# Patient Record
Sex: Male | Born: 1940 | Race: Black or African American | Hispanic: No | Marital: Married | Smoking: Former smoker
Health system: Southern US, Community
[De-identification: ages and names within clinical notes are randomized; demographics above are authoritative.]

## PROBLEM LIST (undated history)

## (undated) DIAGNOSIS — E119 Type 2 diabetes mellitus without complications: Secondary | ICD-10-CM

## (undated) DIAGNOSIS — I10 Essential (primary) hypertension: Secondary | ICD-10-CM

## (undated) DIAGNOSIS — J449 Chronic obstructive pulmonary disease, unspecified: Secondary | ICD-10-CM

## (undated) DIAGNOSIS — F0390 Unspecified dementia without behavioral disturbance: Secondary | ICD-10-CM

---

## 2013-08-17 ENCOUNTER — Inpatient Hospital Stay (HOSPITAL_BASED_OUTPATIENT_CLINIC_OR_DEPARTMENT_OTHER)
Admission: EM | Admit: 2013-08-17 | Discharge: 2013-09-06 | DRG: 870 | Disposition: A | Discharge status: Hospice | Payer: Medicare Other | Attending: Internal Medicine | Admitting: Internal Medicine

## 2013-08-17 ENCOUNTER — Inpatient Hospital Stay (HOSPITAL_COMMUNITY): Payer: Medicare Other

## 2013-08-17 ENCOUNTER — Emergency Department (HOSPITAL_BASED_OUTPATIENT_CLINIC_OR_DEPARTMENT_OTHER): Payer: Medicare Other

## 2013-08-17 ENCOUNTER — Encounter (HOSPITAL_BASED_OUTPATIENT_CLINIC_OR_DEPARTMENT_OTHER): Payer: Self-pay | Admitting: Emergency Medicine

## 2013-08-17 DIAGNOSIS — D539 Nutritional anemia, unspecified: Secondary | ICD-10-CM | POA: Diagnosis not present

## 2013-08-17 DIAGNOSIS — R6520 Severe sepsis without septic shock: Secondary | ICD-10-CM | POA: Diagnosis present

## 2013-08-17 DIAGNOSIS — R5381 Other malaise: Secondary | ICD-10-CM | POA: Diagnosis not present

## 2013-08-17 DIAGNOSIS — I10 Essential (primary) hypertension: Secondary | ICD-10-CM | POA: Diagnosis present

## 2013-08-17 DIAGNOSIS — E1169 Type 2 diabetes mellitus with other specified complication: Secondary | ICD-10-CM | POA: Diagnosis not present

## 2013-08-17 DIAGNOSIS — Z515 Encounter for palliative care: Secondary | ICD-10-CM

## 2013-08-17 DIAGNOSIS — J9600 Acute respiratory failure, unspecified whether with hypoxia or hypercapnia: Secondary | ICD-10-CM | POA: Diagnosis present

## 2013-08-17 DIAGNOSIS — I5032 Chronic diastolic (congestive) heart failure: Secondary | ICD-10-CM | POA: Diagnosis present

## 2013-08-17 DIAGNOSIS — R0600 Dyspnea, unspecified: Secondary | ICD-10-CM

## 2013-08-17 DIAGNOSIS — J9692 Respiratory failure, unspecified with hypercapnia: Secondary | ICD-10-CM

## 2013-08-17 DIAGNOSIS — Z79899 Other long term (current) drug therapy: Secondary | ICD-10-CM

## 2013-08-17 DIAGNOSIS — R0609 Other forms of dyspnea: Secondary | ICD-10-CM | POA: Diagnosis present

## 2013-08-17 DIAGNOSIS — E873 Alkalosis: Secondary | ICD-10-CM | POA: Diagnosis not present

## 2013-08-17 DIAGNOSIS — R6521 Severe sepsis with septic shock: Secondary | ICD-10-CM

## 2013-08-17 DIAGNOSIS — F0390 Unspecified dementia without behavioral disturbance: Secondary | ICD-10-CM | POA: Diagnosis present

## 2013-08-17 DIAGNOSIS — Z87891 Personal history of nicotine dependence: Secondary | ICD-10-CM | POA: Diagnosis not present

## 2013-08-17 DIAGNOSIS — E871 Hypo-osmolality and hyponatremia: Secondary | ICD-10-CM | POA: Diagnosis present

## 2013-08-17 DIAGNOSIS — K5900 Constipation, unspecified: Secondary | ICD-10-CM | POA: Diagnosis not present

## 2013-08-17 DIAGNOSIS — Z66 Do not resuscitate: Secondary | ICD-10-CM | POA: Diagnosis not present

## 2013-08-17 DIAGNOSIS — J9601 Acute respiratory failure with hypoxia: Secondary | ICD-10-CM

## 2013-08-17 DIAGNOSIS — E872 Acidosis, unspecified: Secondary | ICD-10-CM | POA: Diagnosis present

## 2013-08-17 DIAGNOSIS — Z794 Long term (current) use of insulin: Secondary | ICD-10-CM | POA: Diagnosis not present

## 2013-08-17 DIAGNOSIS — F411 Generalized anxiety disorder: Secondary | ICD-10-CM

## 2013-08-17 DIAGNOSIS — Z7982 Long term (current) use of aspirin: Secondary | ICD-10-CM

## 2013-08-17 DIAGNOSIS — R0989 Other specified symptoms and signs involving the circulatory and respiratory systems: Secondary | ICD-10-CM | POA: Diagnosis present

## 2013-08-17 DIAGNOSIS — J690 Pneumonitis due to inhalation of food and vomit: Secondary | ICD-10-CM | POA: Diagnosis present

## 2013-08-17 DIAGNOSIS — G9340 Encephalopathy, unspecified: Secondary | ICD-10-CM | POA: Diagnosis present

## 2013-08-17 DIAGNOSIS — E118 Type 2 diabetes mellitus with unspecified complications: Secondary | ICD-10-CM

## 2013-08-17 DIAGNOSIS — E119 Type 2 diabetes mellitus without complications: Secondary | ICD-10-CM | POA: Diagnosis present

## 2013-08-17 DIAGNOSIS — J189 Pneumonia, unspecified organism: Secondary | ICD-10-CM

## 2013-08-17 DIAGNOSIS — J9691 Respiratory failure, unspecified with hypoxia: Secondary | ICD-10-CM

## 2013-08-17 DIAGNOSIS — A419 Sepsis, unspecified organism: Secondary | ICD-10-CM | POA: Diagnosis present

## 2013-08-17 DIAGNOSIS — R1310 Dysphagia, unspecified: Secondary | ICD-10-CM | POA: Diagnosis not present

## 2013-08-17 DIAGNOSIS — E876 Hypokalemia: Secondary | ICD-10-CM | POA: Diagnosis not present

## 2013-08-17 DIAGNOSIS — J441 Chronic obstructive pulmonary disease with (acute) exacerbation: Secondary | ICD-10-CM | POA: Diagnosis present

## 2013-08-17 DIAGNOSIS — E870 Hyperosmolality and hypernatremia: Secondary | ICD-10-CM | POA: Diagnosis not present

## 2013-08-17 DIAGNOSIS — J80 Acute respiratory distress syndrome: Secondary | ICD-10-CM

## 2013-08-17 DIAGNOSIS — R29898 Other symptoms and signs involving the musculoskeletal system: Secondary | ICD-10-CM | POA: Diagnosis present

## 2013-08-17 HISTORY — DX: Type 2 diabetes mellitus without complications: E11.9

## 2013-08-17 HISTORY — DX: Chronic obstructive pulmonary disease, unspecified: J44.9

## 2013-08-17 HISTORY — DX: Unspecified dementia, unspecified severity, without behavioral disturbance, psychotic disturbance, mood disturbance, and anxiety: F03.90

## 2013-08-17 HISTORY — DX: Essential (primary) hypertension: I10

## 2013-08-17 LAB — CBC WITH DIFFERENTIAL/PLATELET
Basophils Absolute: 0 10*3/uL (ref 0.0–0.1)
Basophils Relative: 0 % (ref 0–1)
EOS ABS: 0 10*3/uL (ref 0.0–0.7)
EOS PCT: 0 % (ref 0–5)
HEMATOCRIT: 41.8 % (ref 39.0–52.0)
Hemoglobin: 14.8 g/dL (ref 13.0–17.0)
Lymphocytes Relative: 12 % (ref 12–46)
Lymphs Abs: 1 10*3/uL (ref 0.7–4.0)
MCH: 33.1 pg (ref 26.0–34.0)
MCHC: 35.4 g/dL (ref 30.0–36.0)
MCV: 93.5 fL (ref 78.0–100.0)
MONO ABS: 0.4 10*3/uL (ref 0.1–1.0)
Monocytes Relative: 5 % (ref 3–12)
Neutro Abs: 6.8 10*3/uL (ref 1.7–7.7)
Neutrophils Relative %: 84 % — ABNORMAL HIGH (ref 43–77)
Platelets: 263 10*3/uL (ref 150–400)
RBC: 4.47 MIL/uL (ref 4.22–5.81)
RDW: 13 % (ref 11.5–15.5)
WBC: 8.1 10*3/uL (ref 4.0–10.5)

## 2013-08-17 LAB — URINE MICROSCOPIC-ADD ON

## 2013-08-17 LAB — URINALYSIS, ROUTINE W REFLEX MICROSCOPIC
Bilirubin Urine: NEGATIVE
Bilirubin Urine: NEGATIVE
Glucose, UA: NEGATIVE mg/dL
Glucose, UA: NEGATIVE mg/dL
KETONES UR: NEGATIVE mg/dL
Ketones, ur: NEGATIVE mg/dL
NITRITE: NEGATIVE
Nitrite: NEGATIVE
PROTEIN: 30 mg/dL — AB
Protein, ur: NEGATIVE mg/dL
SPECIFIC GRAVITY, URINE: 1.018 (ref 1.005–1.030)
Specific Gravity, Urine: 1.016 (ref 1.005–1.030)
UROBILINOGEN UA: 0.2 mg/dL (ref 0.0–1.0)
UROBILINOGEN UA: 0.2 mg/dL (ref 0.0–1.0)
pH: 5.5 (ref 5.0–8.0)
pH: 5 (ref 5.0–8.0)

## 2013-08-17 LAB — BLOOD GAS, ARTERIAL
Acid-Base Excess: 0.1 mmol/L (ref 0.0–2.0)
BICARBONATE: 26 meq/L — AB (ref 20.0–24.0)
Drawn by: 28701
FIO2: 1 %
MECHVT: 440 mL
O2 SAT: 89.3 %
PEEP: 10 cmH2O
Patient temperature: 98.6
RATE: 26 resp/min
TCO2: 27.7 mmol/L (ref 0–100)
pCO2 arterial: 56.4 mmHg — ABNORMAL HIGH (ref 35.0–45.0)
pH, Arterial: 7.285 — ABNORMAL LOW (ref 7.350–7.450)
pO2, Arterial: 59.7 mmHg — ABNORMAL LOW (ref 80.0–100.0)

## 2013-08-17 LAB — COMPREHENSIVE METABOLIC PANEL
ALT: 33 U/L (ref 0–53)
AST: 30 U/L (ref 0–37)
Albumin: 4 g/dL (ref 3.5–5.2)
Alkaline Phosphatase: 73 U/L (ref 39–117)
BILIRUBIN TOTAL: 0.4 mg/dL (ref 0.3–1.2)
BUN: 24 mg/dL — AB (ref 6–23)
CALCIUM: 9.1 mg/dL (ref 8.4–10.5)
CO2: 31 meq/L (ref 19–32)
CREATININE: 0.9 mg/dL (ref 0.50–1.35)
Chloride: 78 mEq/L — ABNORMAL LOW (ref 96–112)
GFR calc Af Amer: >90 mL/min (ref >90)
GFR calc non Af Amer: 82 mL/min — ABNORMAL LOW (ref >90)
Glucose, Bld: 226 mg/dL — ABNORMAL HIGH (ref 70–99)
Potassium: 6.3 mEq/L — ABNORMAL HIGH (ref 3.7–5.3)
Sodium: 122 mEq/L — ABNORMAL LOW (ref 137–147)
Total Protein: 7.8 g/dL (ref 6.0–8.3)

## 2013-08-17 LAB — TROPONIN I: Troponin I: <0.3 ng/mL (ref <0.30)

## 2013-08-17 LAB — GLUCOSE, CAPILLARY: Glucose-Capillary: 166 mg/dL — ABNORMAL HIGH (ref 70–99)

## 2013-08-17 LAB — MRSA PCR SCREENING: MRSA BY PCR: NEGATIVE

## 2013-08-17 LAB — PROTIME-INR
INR: 1.09 (ref 0.00–1.49)
Prothrombin Time: 14.1 seconds (ref 11.6–15.2)

## 2013-08-17 LAB — OCCULT BLOOD X 1 CARD TO LAB, STOOL: Fecal Occult Bld: NEGATIVE

## 2013-08-17 LAB — I-STAT ARTERIAL BLOOD GAS, ED
ACID-BASE EXCESS: 4 mmol/L — AB (ref 0.0–2.0)
BICARBONATE: 33.7 meq/L — AB (ref 20.0–24.0)
O2 Saturation: 99 %
PO2 ART: 149 mmHg — AB (ref 80.0–100.0)
TCO2: 36 mmol/L (ref 0–100)
pCO2 arterial: 75.9 mmHg (ref 35.0–45.0)
pH, Arterial: 7.255 — ABNORMAL LOW (ref 7.350–7.450)

## 2013-08-17 LAB — CBG MONITORING, ED: Glucose-Capillary: 215 mg/dL — ABNORMAL HIGH (ref 70–99)

## 2013-08-17 LAB — APTT: aPTT: 28 seconds (ref 24–37)

## 2013-08-17 LAB — I-STAT CG4 LACTIC ACID, ED: Lactic Acid, Venous: 2.15 mmol/L (ref 0.5–2.2)

## 2013-08-17 MED ORDER — PANTOPRAZOLE SODIUM 40 MG IV SOLR
40.0000 mg | Freq: Every day | INTRAVENOUS | Status: DC
  Administered 2013-08-18: 40 mg via INTRAVENOUS
  Filled 2013-08-17 (×2): qty 40

## 2013-08-17 MED ORDER — ALBUTEROL SULFATE (2.5 MG/3ML) 0.083% IN NEBU: 2.5000 mg | INHALATION_SOLUTION | RESPIRATORY_TRACT | Status: DC | PRN

## 2013-08-17 MED ORDER — PROPOFOL 10 MG/ML IV EMUL: 5.0000 ug/kg/min | INTRAVENOUS | Status: DC

## 2013-08-17 MED ORDER — FENTANYL CITRATE 0.05 MG/ML IJ SOLN
INTRAMUSCULAR | Status: AC
  Administered 2013-08-17: 50 ug via INTRAVENOUS
  Filled 2013-08-17: qty 2

## 2013-08-17 MED ORDER — SODIUM CHLORIDE 0.9 % IV BOLUS (SEPSIS)
1000.0000 mL | Freq: Once | INTRAVENOUS | Status: AC
  Administered 2013-08-17: 1000 mL via INTRAVENOUS

## 2013-08-17 MED ORDER — FENTANYL CITRATE 0.05 MG/ML IJ SOLN
50.0000 ug | INTRAMUSCULAR | Status: DC | PRN
  Administered 2013-08-18: 50 ug via INTRAVENOUS

## 2013-08-17 MED ORDER — INSULIN ASPART 100 UNIT/ML ~~LOC~~ SOLN
2.0000 [IU] | SUBCUTANEOUS | Status: DC
  Administered 2013-08-18: 4 [IU] via SUBCUTANEOUS
  Administered 2013-08-18: 2 [IU] via SUBCUTANEOUS
  Administered 2013-08-18: 4 [IU] via SUBCUTANEOUS
  Administered 2013-08-18: 6 [IU] via SUBCUTANEOUS
  Administered 2013-08-18 – 2013-08-19 (×5): 4 [IU] via SUBCUTANEOUS
  Administered 2013-08-19: 6 [IU] via SUBCUTANEOUS
  Administered 2013-08-19 – 2013-08-21 (×5): 2 [IU] via SUBCUTANEOUS
  Administered 2013-08-21 (×3): 4 [IU] via SUBCUTANEOUS
  Administered 2013-08-21: 2 [IU] via SUBCUTANEOUS
  Administered 2013-08-21 – 2013-08-22 (×3): 4 [IU] via SUBCUTANEOUS

## 2013-08-17 MED ORDER — ROCURONIUM BROMIDE 50 MG/5ML IV SOLN
INTRAVENOUS | Status: DC
Start: 2013-08-17 — End: 2013-08-17
  Filled 2013-08-17: qty 2

## 2013-08-17 MED ORDER — PROPOFOL 10 MG/ML IV EMUL
5.0000 ug/kg/min | INTRAVENOUS | Status: DC
  Administered 2013-08-17: 5 ug/kg/min via INTRAVENOUS

## 2013-08-17 MED ORDER — HEPARIN SODIUM (PORCINE) 5000 UNIT/ML IJ SOLN
5000.0000 [IU] | Freq: Three times a day (TID) | INTRAMUSCULAR | Status: DC
  Administered 2013-08-18 – 2013-08-28 (×31): 5000 [IU] via SUBCUTANEOUS
  Filled 2013-08-17 (×34): qty 1

## 2013-08-17 MED ORDER — PROPOFOL 10 MG/ML IV EMUL
INTRAVENOUS | Status: AC
  Filled 2013-08-17: qty 100

## 2013-08-17 MED ORDER — SODIUM CHLORIDE 0.9 % IV SOLN
250.0000 mL | INTRAVENOUS | Status: DC | PRN
Start: 2013-08-17 — End: 2013-09-06

## 2013-08-17 MED ORDER — VANCOMYCIN HCL IN DEXTROSE 1-5 GM/200ML-% IV SOLN
1000.0000 mg | Freq: Once | INTRAVENOUS | Status: AC
  Administered 2013-08-18: 1000 mg via INTRAVENOUS
  Filled 2013-08-17: qty 200

## 2013-08-17 MED ORDER — SODIUM CHLORIDE 0.9 % IV SOLN
20.0000 ug/h | INTRAVENOUS | Status: DC
  Administered 2013-08-18: 20 ug/h via INTRAVENOUS
  Filled 2013-08-17: qty 50

## 2013-08-17 MED ORDER — FENTANYL CITRATE 0.05 MG/ML IJ SOLN
INTRAMUSCULAR | Status: AC
  Filled 2013-08-17: qty 2

## 2013-08-17 MED ORDER — SODIUM CHLORIDE 0.9 % IV SOLN
2.0000 mg/h | INTRAVENOUS | Status: DC
  Filled 2013-08-17: qty 10

## 2013-08-17 MED ORDER — PIPERACILLIN-TAZOBACTAM 3.375 G IVPB
3.3750 g | Freq: Three times a day (TID) | INTRAVENOUS | Status: DC
  Administered 2013-08-18 – 2013-08-19 (×4): 3.375 g via INTRAVENOUS
  Filled 2013-08-17 (×6): qty 50

## 2013-08-17 MED ORDER — ROCURONIUM BROMIDE 50 MG/5ML IV SOLN
INTRAVENOUS | Status: AC
  Filled 2013-08-17: qty 2

## 2013-08-17 MED ORDER — FENTANYL CITRATE 0.05 MG/ML IJ SOLN
100.0000 ug | Freq: Once | INTRAMUSCULAR | Status: AC
  Administered 2013-08-17: 100 ug via INTRAVENOUS

## 2013-08-17 MED ORDER — VANCOMYCIN HCL IN DEXTROSE 1-5 GM/200ML-% IV SOLN
1000.0000 mg | Freq: Two times a day (BID) | INTRAVENOUS | Status: DC
  Administered 2013-08-18 – 2013-08-20 (×4): 1000 mg via INTRAVENOUS
  Filled 2013-08-17 (×5): qty 200

## 2013-08-17 MED ORDER — SUCCINYLCHOLINE CHLORIDE 20 MG/ML IJ SOLN
INTRAMUSCULAR | Status: AC
  Administered 2013-08-17: 80 mg
  Filled 2013-08-17: qty 1

## 2013-08-17 MED ORDER — VECURONIUM BROMIDE 10 MG IV SOLR
10.0000 mg | Freq: Once | INTRAVENOUS | Status: AC
  Administered 2013-08-17: 10 mg via INTRAVENOUS

## 2013-08-17 MED ORDER — MIDAZOLAM HCL 5 MG/ML IJ SOLN
0.0000 mg/h | INTRAMUSCULAR | Status: DC
  Administered 2013-08-18: 1 mg/h via INTRAVENOUS
  Filled 2013-08-17: qty 10

## 2013-08-17 MED ORDER — VECURONIUM BROMIDE 10 MG IV SOLR
INTRAVENOUS | Status: AC
  Administered 2013-08-17: 10 mg
  Filled 2013-08-17: qty 10

## 2013-08-17 MED ORDER — MIDAZOLAM HCL 2 MG/2ML IJ SOLN
2.0000 mg | Freq: Once | INTRAMUSCULAR | Status: AC
  Administered 2013-08-17: 2 mg via INTRAVENOUS

## 2013-08-17 MED ORDER — FENTANYL CITRATE 0.05 MG/ML IJ SOLN: 100.0000 ug | Freq: Once | INTRAMUSCULAR | Status: DC

## 2013-08-17 MED ORDER — FENTANYL CITRATE 0.05 MG/ML IJ SOLN
50.0000 ug | Freq: Once | INTRAMUSCULAR | Status: AC
  Administered 2013-08-17: 50 ug via INTRAVENOUS

## 2013-08-17 MED ORDER — IPRATROPIUM-ALBUTEROL 0.5-2.5 (3) MG/3ML IN SOLN
3.0000 mL | RESPIRATORY_TRACT | Status: DC
  Administered 2013-08-18: 3 mL via RESPIRATORY_TRACT

## 2013-08-17 MED ORDER — MIDAZOLAM HCL 2 MG/2ML IJ SOLN
INTRAMUSCULAR | Status: AC
  Administered 2013-08-17: 2 mg via INTRAVENOUS
  Filled 2013-08-17: qty 2

## 2013-08-17 MED ORDER — PANTOPRAZOLE SODIUM 40 MG IV SOLR
40.0000 mg | Freq: Once | INTRAVENOUS | Status: AC
Start: 2013-08-17 — End: 2013-08-17
  Administered 2013-08-17: 40 mg via INTRAVENOUS
  Filled 2013-08-17: qty 40

## 2013-08-17 MED ORDER — NOREPINEPHRINE BITARTRATE 1 MG/ML IV SOLN
2.0000 ug/min | INTRAVENOUS | Status: DC
  Administered 2013-08-18: 30 ug/min via INTRAVENOUS
  Administered 2013-08-18 (×2): 40 ug/min via INTRAVENOUS
  Administered 2013-08-18: 4 ug/min via INTRAVENOUS
  Administered 2013-08-18: 33 ug/min via INTRAVENOUS
  Filled 2013-08-17 (×6): qty 8

## 2013-08-17 MED ORDER — ETOMIDATE 2 MG/ML IV SOLN
INTRAVENOUS | Status: AC
  Administered 2013-08-17: 20 mg
  Filled 2013-08-17: qty 20

## 2013-08-17 MED ORDER — SODIUM CHLORIDE 0.9 % IV BOLUS (SEPSIS): 1000.0000 mL | Freq: Once | INTRAVENOUS | Status: DC

## 2013-08-17 MED ORDER — MIDAZOLAM HCL 2 MG/2ML IJ SOLN
INTRAMUSCULAR | Status: AC
  Administered 2013-08-17: 2 mg
  Filled 2013-08-17: qty 2

## 2013-08-17 MED ORDER — SUCCINYLCHOLINE CHLORIDE 20 MG/ML IJ SOLN
INTRAMUSCULAR | Status: AC
  Filled 2013-08-17: qty 1

## 2013-08-17 MED ORDER — LIDOCAINE HCL (CARDIAC) 20 MG/ML IV SOLN
INTRAVENOUS | Status: AC
  Filled 2013-08-17: qty 5

## 2013-08-17 MED ORDER — MIDAZOLAM HCL 2 MG/2ML IJ SOLN
INTRAMUSCULAR | Status: AC
  Filled 2013-08-17: qty 2

## 2013-08-17 MED ORDER — FENTANYL CITRATE 0.05 MG/ML IJ SOLN: 50.0000 ug | INTRAMUSCULAR | Status: DC | PRN

## 2013-08-17 MED ORDER — ETOMIDATE 2 MG/ML IV SOLN
INTRAVENOUS | Status: AC
  Filled 2013-08-17: qty 20

## 2013-08-17 MED ORDER — MIDAZOLAM HCL 2 MG/2ML IJ SOLN
1.0000 mg | Freq: Once | INTRAMUSCULAR | Status: AC
  Administered 2013-08-17: 1 mg via INTRAVENOUS

## 2013-08-17 MED ORDER — PIPERACILLIN-TAZOBACTAM 3.375 G IVPB 30 MIN
3.3750 g | Freq: Once | INTRAVENOUS | Status: AC
  Administered 2013-08-17: 3.375 g via INTRAVENOUS
  Filled 2013-08-17 (×2): qty 50

## 2013-08-17 NOTE — ED Notes (Signed)
[facility] given report currently [facility]. Family members wife and sons x2 updated to current status and consent signed by son per mothers / wife's request and signature witness by this Clinical research associatewriter

## 2013-08-17 NOTE — Progress Notes (Signed)
Per MD Documented setting changes done:  PEEP increased to 10, Vt decreased to 6cc (440), RR increased to 26.  ABG to follow in 30 mins.

## 2013-08-17 NOTE — ED Notes (Signed)
Patient came in with shortness of breath. In triage o2 sats in the 20-30's. Patient unresponsive

## 2013-08-17 NOTE — ED Notes (Signed)
Family at bedside prior to carelink leaving

## 2013-08-17 NOTE — ED Provider Notes (Signed)
CSN: [id number]     Arrival date & time 08/17/13  1803 History   First MD Initiated Contact with Patient 08/17/13 1809    This chart was scribed for Rolan Bucco, MD by Marica Otter, ED Scribe. This patient was seen in room 2M03C/2M03C-01 and the patient's care was started at 6:10 PM.  Chief Complaint  Patient presents with  . Shortness of Breath   The history is provided by the spouse. No language interpreter was used.   HPI Comments: [name] [name] is a 73 y.o. male, with a Hx of DM, who presents to the Emergency Department complaining of SOB. Upon arrival to the ED pt's O2 stats in triage were in the 20-30s. Pt is unresponsive.   Pt presented to [facility] a little over one week ago for SOB. Consequently, he was admitted to the hospital for 5 days. While hospitalized pt was diagnosed with COPD and low sodium. Pt was discharged from the hospital three days ago; since discharge pt has been experiencing increasing fatigue, SOB, and productive cough with clear sputum. Per family, pt also had one episode of non-bloody emesis and possible, subjective fever today. Pt also recently completed a course of Levaquin for respiratory infection and Valtrex for shingles.   Hx limited and obtained per family due to pt condition.  Past Medical History  Diagnosis Date  . Hypertension   . Diabetes mellitus without complication   . COPD (chronic obstructive pulmonary disease)    History reviewed. No pertinent past surgical history. No family history on file. History  Substance Use Topics  . Smoking status: Former Games developer  . Smokeless tobacco: Not on file  . Alcohol Use: Not on file    Review of Systems  Constitutional: Positive for fever and fatigue. Negative for diaphoresis.  HENT: Negative for congestion, rhinorrhea and sneezing.   Eyes: Negative.   Respiratory: Positive for cough (with clear sputum ) and shortness of breath. Negative for chest tightness.   Cardiovascular: Negative for  chest pain and leg swelling.  Gastrointestinal: Positive for vomiting. Negative for abdominal pain, diarrhea and blood in stool.  Genitourinary: Negative for frequency, hematuria, flank pain and difficulty urinating.  Musculoskeletal: Negative for arthralgias and back pain.  Skin: Negative for rash.      Allergies  Review of patient's allergies indicates no known allergies.  Home Medications   Prior to Admission medications   Medication Sig Start Date End Date Taking? Authorizing Provider  amLODipine (NORVASC) 5 MG tablet Take 5 mg by mouth daily.   Yes Historical Provider, MD  aspirin 325 MG tablet Take 325 mg by mouth daily.   Yes Historical Provider, MD  docusate sodium (COLACE) 100 MG capsule Take 100 mg by mouth 2 (two) times daily.   Yes Historical Provider, MD  metFORMIN (GLUCOPHAGE) 500 MG tablet Take 500 mg by mouth 2 (two) times daily with a meal.   Yes Historical Provider, MD  solifenacin (VESICARE) 10 MG tablet Take by mouth daily.   Yes Historical Provider, MD  valACYclovir (VALTREX) 500 MG tablet Take 500 mg by mouth 2 (two) times daily.   Yes Historical Provider, MD   Triage Vitals: BP 168/82  Pulse 121  Resp 14  SpO2 99%  Physical Exam  Constitutional: He appears well-developed and well-nourished. He appears distressed.  unresponsive  HENT:  Head: Normocephalic and atraumatic.  Dark secretions in mouth  Eyes: Pupils are equal, round, and reactive to light.  Neck: Normal range of motion. Neck supple.  Cardiovascular: Normal rate, regular rhythm and normal heart sounds.   Pulmonary/Chest: Bradypnea noted. He is in respiratory distress. He has no wheezes. He has no rales. He exhibits no tenderness.  Limited spontaneous respiratory effort.  Pulse ox 20% on arrival on RA with good waveform  Abdominal: Soft. Bowel sounds are normal. There is no tenderness. There is no rebound and no guarding.  Musculoskeletal: Normal range of motion. He exhibits no edema.   Lymphadenopathy:    He has no cervical adenopathy.  Neurological:  unresponsive  Skin: Skin is warm and dry. No rash noted.  Psychiatric: He has a normal mood and affect.    ED Course  INTUBATION Date/Time: 08/17/2013 6:10 PM Performed by: Rolan Bucco Authorized by: Rolan Bucco Consent: The procedure was performed in an emergent situation. Indications: respiratory failure and  hypoxemia Intubation method: video-assisted Patient status: paralyzed (RSI) Preoxygenation: BVM Sedatives: etomidate Paralytic: succinylcholine Laryngoscope size: Mac 3 Tube size: 7.5 mm Tube type: cuffed Number of attempts: 3 Ventilation between attempts: BVM Cricoid pressure: yes Cords visualized: yes Post-procedure assessment: chest rise and CO2 detector Breath sounds: equal and absent over the epigastrium Cuff inflated: yes ETT to lip: 23 cm Tube secured with: ETT holder Chest x-ray interpreted by me and radiologist. Chest x-ray findings: endotracheal tube in appropriate position Patient tolerance: Patient tolerated the procedure well with no immediate complications. Comments: Pt with profuse, ongoing coffee ground emesis in oropharynx.  Had to be suctioned multiple times in between intubation attempts to visualize cords.  Pt never dropped sats below 92% during intubation.   (including critical care time)   COORDINATION OF CARE: 6:11 PM-Discussed treatment plan which includes endotracheal intubation with pt's family at bedside. Patient's family verbalizes understanding and agrees with treatment plan.  CRITICAL CARE (Endotracheal intubation) Performed by: Rolan Bucco, MD Total critical care time: 6:10PM-6:29PM 6:16PM: Pt's SpO2: 99% 6:18PM: etomidate (AMIDATE) 2 MG/ML injection 20 mg given 6:19PM: succinylcholine (ANECTINE) 20 MG/ML injection 80 mg given 6:19PM: Pt's BP: 168/82 6:25PM- Pt's SPO2: 91%; BP 142/58 Critical care time was exclusive of separately billable procedures and  treating other patients. Critical care was necessary to treat or prevent imminent or life-threatening deterioration. Critical care was time spent personally by me on the following activities: development of treatment plan with surrogate as well as nursing, evaluation of patient's response to treatment, examination of patient, obtaining history from patient or surrogate, ordering and performing treatments and interventions, ordering and review of laboratory studies, ordering and review of radiographic studies, pulse oximetry and re-evaluation of patient's condition.  CRITICAL CARE NOTE: Critical care time was provided for 90 MIN exclusive of separately billable procedures and treating other patients.  This was necessary to treat or prevent further deterioration of the following condition:  respiratory failure, which the patient had. This involved direct bedside patient care, speaking with family members, review of past medical records, reviewing the results of the laboratory and diagnostic studies, consulting with other physicians, as well as evaluating the effectiveness of the therapy instituted as described.  Labs Review Labs Reviewed  CBC WITH DIFFERENTIAL - Abnormal; Notable for the following:    Neutrophils Relative % 84 (*)    All other components within normal limits  COMPREHENSIVE METABOLIC PANEL - Abnormal; Notable for the following:    Sodium 122 (*)    Potassium 6.3 (*)    Chloride 78 (*)    Glucose, Bld 226 (*)    BUN 24 (*)    GFR calc non Af Amer 82 (*)  All other components within normal limits  URINALYSIS, ROUTINE W REFLEX MICROSCOPIC - Abnormal; Notable for the following:    APPearance CLOUDY (*)    Hgb urine dipstick SMALL (*)    Protein, ur 30 (*)    Leukocytes, UA MODERATE (*)    All other components within normal limits  URINE MICROSCOPIC-ADD ON - Abnormal; Notable for the following:    Bacteria, UA MANY (*)    Casts HYALINE CASTS (*)    All other components within  normal limits  URINALYSIS, ROUTINE W REFLEX MICROSCOPIC - Abnormal; Notable for the following:    Hgb urine dipstick SMALL (*)    Leukocytes, UA MODERATE (*)    All other components within normal limits  URINE MICROSCOPIC-ADD ON - Abnormal; Notable for the following:    Squamous Epithelial / LPF FEW (*)    Bacteria, UA FEW (*)    All other components within normal limits  BLOOD GAS, ARTERIAL - Abnormal; Notable for the following:    pH, Arterial 7.285 (*)    pCO2 arterial 56.4 (*)    pO2, Arterial 59.7 (*)    Bicarbonate 26.0 (*)    All other components within normal limits  GLUCOSE, CAPILLARY - Abnormal; Notable for the following:    Glucose-Capillary 166 (*)    All other components within normal limits  CBG MONITORING, ED - Abnormal; Notable for the following:    Glucose-Capillary 215 (*)    All other components within normal limits  I-STAT ARTERIAL BLOOD GAS, ED - Abnormal; Notable for the following:    pH, Arterial 7.255 (*)    pCO2 arterial 75.9 (*)    pO2, Arterial 149.0 (*)    Bicarbonate 33.7 (*)    Acid-Base Excess 4.0 (*)    All other components within normal limits  MRSA PCR SCREENING  URINE CULTURE  CULTURE, BLOOD (ROUTINE X 2)  CULTURE, BLOOD (ROUTINE X 2)  CULTURE, RESPIRATORY (NON-EXPECTORATED)  TROPONIN I  OCCULT BLOOD X 1 CARD TO LAB, STOOL  PROTIME-INR  APTT  CBC  LACTIC ACID, PLASMA  PRO B NATRIURETIC PEPTIDE  MAGNESIUM  PHOSPHORUS  COMPREHENSIVE METABOLIC PANEL  I-STAT CG4 LACTIC ACID, ED  I-STAT CG4 LACTIC ACID, ED  POCT GASTRIC OCCULT BLOOD (1-CARD TO LAB)    Imaging Review Ct Head Wo Contrast  08/17/2013   CLINICAL DATA:  Status post intubation with altered mental status.  EXAM: CT HEAD WITHOUT CONTRAST  TECHNIQUE: Contiguous axial images were obtained from the base of the skull through the vertex without intravenous contrast.  COMPARISON:  08/08/2013  FINDINGS: Sinuses/Soft tissues: Fluid and secretions in the nasopharynx. Trace fluid in the  bilateral maxillary sinuses. No significant soft tissue swelling. Mucosal thickening of ethmoid air cells, new. Clear mastoid air cells.  Intracranial: Mild low density in the periventricular white matter likely related to small vessel disease. No mass lesion, hemorrhage, hydrocephalus, acute infarct, intra-axial, or extra-axial fluid collection.  IMPRESSION: 1.  No acute intracranial abnormality. 2. Mild small vessel ischemic change. 3. New mild sinus disease. This could at least partially be secondary to intubation.   Electronically Signed   By: Jeronimo GreavesKyle  Talbot M.D.   On: 08/17/2013 20:15   Dg Chest Port 1 View  08/17/2013   CLINICAL DATA:  Line placement  EXAM: PORTABLE CHEST - 1 VIEW  COMPARISON:  Chest x-ray from earlier the same day  FINDINGS: Interval placement of left subclavian central line. No complicating pneumothorax or visible hemothorax. The endotracheal tube remains in good  position, tip at the mid thoracic trachea level. Orogastric tube ends in the mid stomach.  Stable heart size and upper mediastinal contours.  Diffuse worsening of lung aeration, with fairly symmetric and perihilar predominant airspace disease.  IMPRESSION: 1. New left subclavian central line in good position. No pneumothorax. 2. Extensive bilateral lung opacity. Rapid worsening favors pulmonary edema (likely noncardiogenic), large volume aspiration, or pneumonia responding to hydration.   Electronically Signed   By: Tiburcio PeaJonathan  Watts M.D.   On: 08/17/2013 23:45   Dg Chest Port 1 View  08/17/2013   CLINICAL DATA:  Shortness of breath.  Status post intubation.  EXAM: PORTABLE CHEST - 1 VIEW  COMPARISON:  08/14/2013  FINDINGS: Endotracheal tube terminates 4.4 cm above carina. Nasogastric tube extends beyond the inferior aspect of the film.  Normal heart size. Possible small left pleural effusion. No pneumothorax. New right upper lobe airspace disease. Similar left base volume loss.  IMPRESSION: Appropriate position of endotracheal  tube.  New right upper Lobe airspace disease, suspicious for infection or aspiration  Possible small left pleural effusion with adjacent volume loss and atelectasis.   Electronically Signed   By: Jeronimo GreavesKyle  Talbot M.D.   On: 08/17/2013 19:27     EKG Interpretation None      MDM   Final diagnoses:  Respiratory failure with hypoxia and hypercapnia   Pt presented unresponsive with limited spontaneous respirations.  Intitial sat was 20% on RA.  Was ventilated with BVM and sats came up to 90s over about 1-2 minutes.  Good waveform throughout.  From history, sound as if respiratory failure due to possible COPD worsening.  PT was covered with broad spectrum abx for pneumonia.  He almost certainly aspirated due to large volume of secretions in airway prior to intubation.  He maintained a good BP throughout, other than 2 times, was started on propofol for sedation and dropped BP both times.  BP came back up after fluids and stopping propofol.  Given other meds for sedation/paralysis as he was biting on the ETT.  Pt maintains sats 96-100% following intubation. Has had large amounts of coffee ground emesis, but at this point, HCT normal.  Given protonix.  Rectal heme negative. Hyponatremic, given IVFs.  Pt afebrile.  No ICH or signs of large CVA on CT.  Spoke with CCM, DR. Herma CarsonZ, who has accept pt [facility]  [facility] neg head CT/brain MRI, admission BMP showed sodium 127, discharged at 140, CT chest without contrast negative, negative d-dimer.  Pt was diagnosed with likely COPD exacerbation, metabolic encephalopathy, hyponatremia.  Per note, was discharge with inhalers, but family says that he has not had any inhalers.  I personally performed the services described in this documentation, which was scribed in my presence.  The recorded information has been reviewed and considered.    Rolan BuccoMelanie Belfi, MD 08/18/13 [phone]

## 2013-08-17 NOTE — ED Notes (Signed)
ABG results given to Dr. Fredderick PhenixBelfi, vt increased to 600/ 8cc's rate changed to 18.

## 2013-08-17 NOTE — ED Notes (Signed)
I took vitals prior to transport to x-ray, BP of 75/34 taken automatically, as reason took another BP manually, notified nurse of same.

## 2013-08-17 NOTE — H&P (Signed)
PULMONARY / CRITICAL CARE MEDICINE   Name: [name] [name] MRN: [id number] DOB: February 26, 1940    ADMISSION DATE:  08/17/2013  PRIMARY SERVICE: PCCM  CHIEF COMPLAINT:  Respiratory distress.   BRIEF PATIENT DESCRIPTION:  73 years old with DM, HTN and COPD. Pres[facility] with acute hypoxemic respiratory failure requiring intubation. Chest X ray with bilateral patchy infiltrates.   SIGNIFICANT EVENTS / STUDIES:  - Chest X ray: Bilateral patchy infiltrates.   LINES / TUBES: - Peripheral IV's - Foley Catheter - ETT - Left subclavian CVC  CULTURES: - Bood cultures 6/27 > - Tracheal aspirate 6/27 > - Urine culture 6/27 >  ANTIBIOTICS: - Zosyn  - Vancomycin - Recently completed levaquin for COPD exacerbation.   HISTORY OF PRESENT ILLNESS:   73 years old with DM, HTN and COPD. [facility] with acute hypoxemic respiratory failure. Initial oxygen saturation in the 20's, intubated in the ED. As per the records, he was admitted recently for 5 days with a COPD exacerbation and completed a course of Levaquin. Discharged 3 days ago. Since discharge more fatigued and progressively more SOB. He had an episode of vomiting. Unclear if he had fever today. At the time of my examination the patient is intubated, sedated, hypotensive with MAP of 55 to 60, saturating 83 to 88% on 100% FiO2 and PEEP of 5.   PAST MEDICAL HISTORY :  Past Medical History  Diagnosis Date  . Hypertension   . Diabetes mellitus without complication   . COPD (chronic obstructive pulmonary disease)    History reviewed. No pertinent past surgical history. Prior to Admission medications   Medication Sig Start Date End Date Taking? Authorizing Yakira Duquette  amLODipine (NORVASC) 5 MG tablet Take 5 mg by mouth daily.   Yes Historical Derika Eckles, MD  aspirin 325 MG tablet Take 325 mg by mouth daily.   Yes Historical Joleen Stuckert, MD  docusate sodium (COLACE) 100 MG capsule Take 100 mg by  mouth 2 (two) times daily.   Yes Historical Kam Rahimi, MD  metFORMIN (GLUCOPHAGE) 500 MG tablet Take 500 mg by mouth 2 (two) times daily with a meal.   Yes Historical Neshia Mckenzie, MD  solifenacin (VESICARE) 10 MG tablet Take by mouth daily.   Yes Historical Taylen Wendland, MD  valACYclovir (VALTREX) 500 MG tablet Take 500 mg by mouth 2 (two) times daily.   Yes Historical Kemarion Abbey, MD   No Known Allergies  FAMILY HISTORY:  No family history on file. SOCIAL HISTORY:  reports that he has quit smoking. He does not have any smokeless tobacco history on file. His alcohol and drug histories are not on file.  REVIEW OF SYSTEMS:  Unable to provide  SUBJECTIVE:   VITAL SIGNS: Temp:  [98.5 F (36.9 C)-99.8 F (37.7 C)] 99 F (37.2 C) (06/27 2013) Pulse Rate:  [92-121] 98 (06/27 2230) Resp:  [14-26] 26 (06/27 2230) BP: (64-180)/(34-99) 89/49 mmHg (06/27 2230) SpO2:  [83 %-100 %] 83 % (06/27 2230) FiO2 (%):  [80 %-100 %] 100 % (06/27 2229) Weight:  [173 lb 14.4 oz (78.881 kg)] 173 lb 14.4 oz (78.881 kg) (06/27 2012) HEMODYNAMICS:   VENTILATOR SETTINGS: Vent Mode:  [-] PRVC FiO2 (%):  [80 %-100 %] 100 % Set Rate:  [16 bmp-26 bmp] 26 bmp Vt Set:  [440 mL-600 mL] 440 mL PEEP:  [5 cmH20-10 cmH20] 10 cmH20 Plateau Pressure:  [16 cmH20-34 cmH20] 34 cmH20 INTAKE / OUTPUT: Intake/Output     06/27 0701 - 06/28 0700  Urine (mL/kg/hr) 150   Total Output 150   Net -150         PHYSICAL EXAMINATION: General: Sedated, intubated, no acute distress. Eyes: Anicteric sclerae. Pupils are ENT: ETT in place. Trachea at midline.  Lymph: No cervical, supraclavicular, or axillary lymphadenopathy. Heart: Normal S1, S2. No murmurs, rubs, or gallops appreciated. No bruits, equal pulses. Lungs: Normal excursion, no dullness to percussion. Diminished breath sounds bilaterally, without wheezes or crackles.  Abdomen: Abdomen soft, non-tender and not distended, normoactive bowel sounds. No hepatosplenomegaly or  masses. Musculoskeletal: No clubbing or synovitis. No LE edema Skin: No rashes or lesions Neuro: Patient is unresponsive. Sedated  LABS:  CBC  Recent Labs Lab 08/17/13 1835  WBC 8.1  HGB 14.8  HCT 41.8  PLT 263   Coag's  Recent Labs Lab 08/17/13 1915  APTT 28  INR 1.09   BMET  Recent Labs Lab 08/17/13 1835  NA 122*  K 6.3*  CL 78*  CO2 31  BUN 24*  CREATININE 0.90  GLUCOSE 226*   Electrolytes  Recent Labs Lab 08/17/13 1835  CALCIUM 9.1   Sepsis Markers  Recent Labs Lab 08/17/13 1855  LATICACIDVEN 2.15   ABG  Recent Labs Lab 08/17/13 1922  PHART 7.255*  PCO2ART 75.9*  PO2ART 149.0*   Liver Enzymes  Recent Labs Lab 08/17/13 1835  AST 30  ALT 33  ALKPHOS 73  BILITOT 0.4  ALBUMIN 4.0   Cardiac Enzymes  Recent Labs Lab 08/17/13 1835  TROPONINI <0.30   Glucose  Recent Labs Lab 08/17/13 1817  GLUCAP 215*    Imaging Ct Head Wo Contrast  08/17/2013   CLINICAL DATA:  Status post intubation with altered mental status.  EXAM: CT HEAD WITHOUT CONTRAST  TECHNIQUE: Contiguous axial images were obtained from the base of the skull through the vertex without intravenous contrast.  COMPARISON:  08/08/2013  FINDINGS: Sinuses/Soft tissues: Fluid and secretions in the nasopharynx. Trace fluid in the bilateral maxillary sinuses. No significant soft tissue swelling. Mucosal thickening of ethmoid air cells, new. Clear mastoid air cells.  Intracranial: Mild low density in the periventricular white matter likely related to small vessel disease. No mass lesion, hemorrhage, hydrocephalus, acute infarct, intra-axial, or extra-axial fluid collection.  IMPRESSION: 1.  No acute intracranial abnormality. 2. Mild small vessel ischemic change. 3. New mild sinus disease. This could at least partially be secondary to intubation.   Electronically Signed   By: Jeronimo GreavesKyle  Talbot M.D.   On: 08/17/2013 20:15   Dg Chest Port 1 View  08/17/2013   CLINICAL DATA:  Shortness of  breath.  Status post intubation.  EXAM: PORTABLE CHEST - 1 VIEW  COMPARISON:  08/14/2013  FINDINGS: Endotracheal tube terminates 4.4 cm above carina. Nasogastric tube extends beyond the inferior aspect of the film.  Normal heart size. Possible small left pleural effusion. No pneumothorax. New right upper lobe airspace disease. Similar left base volume loss.  IMPRESSION: Appropriate position of endotracheal tube.  New right upper Lobe airspace disease, suspicious for infection or aspiration  Possible small left pleural effusion with adjacent volume loss and atelectasis.   Electronically Signed   By: Jeronimo GreavesKyle  Talbot M.D.   On: 08/17/2013 19:27     CXR:  Bilateral patchy infiltrates.  ASSESSMENT / PLAN:  PULMONARY A: 1) Acute hypoxemic and hypercarbic respiratory failure. 2) Bilateral patchy infiltrates compatible with ARDS, likely aspiration pneumonia given dark colored tracheal aspirate. 3) COPD P:   - Mechanical ventilation per ARDS protocol   -  PRVC, Vt: 6cc/kg, PEEP: 10, RR: 26, FiO2: 100% and adjust per ARDS protocol   - VAP prevention order set   - Daily awakening and SBT - Zosyn / Vancomycin - Scheduled Duonebs - PRN albuterol nebs  CARDIOVASCULAR A:  1) Hypotension, likely sepsis related  2) Negative troponin, borderline lactic acid, volume depleted on exam. P:  - Will repeat lactic acid - Will get BNP - 1 L NS bolus - Levophed to keep MAP of 65 - Echocardiogram in am  RENAL A:   1) Normal creatinine at admission 2) Hyponatremia, likely volume depletion, poor oral intake. P:   - Continue IVF resuscitation - Will follow CMP  GASTROINTESTINAL A:   1) No issues P:   - Protonix for GI prophylaxis  HEMATOLOGIC A:   1) No issues P:  - Will follow CBC  INFECTIOUS A:   1) Aspiration pneumonia vs HCAP P:   - Zosyn - Vancomycin - We will follow cultures  ENDOCRINE A:   1) DM P:   - Novolog sliding scale per ICU hyperglycemia protocol  NEUROLOGIC A:   1)  Intubated, sedated P:   - RASS goal: -1 to -2 - Versed drip (profound hypotension with propofol) - Fentanyl drip  TODAY'S SUMMARY:   I have personally obtained a history, examined the patient, evaluated laboratory and imaging results, formulated the assessment and plan and placed orders. CRITICAL CARE: The patient is critically ill with multiple organ systems failure and requires high complexity decision making for assessment and support, frequent evaluation and titration of therapies, application of advanced monitoring technologies and extensive interpretation of multiple databases. Critical Care Time devoted to patient care services described in this note is 60 minutes.   Overton Mam, MD Pulmonary and Critical Care Medicine [facility] Pager: [phone]  08/17/2013, 10:54 PM

## 2013-08-17 NOTE — ED Notes (Signed)
Per request of [facility], pt started back on Propofol at 15 mcg/kg/min

## 2013-08-17 NOTE — ED Notes (Signed)
Fentanyl ordered for en route transportation for [facility] to [facility].

## 2013-08-17 NOTE — Procedures (Signed)
Central Venous Catheter Insertion Procedure Note Vaughan BastaJasper Hughart [id number] 07/10/1940  Procedure: Insertion of Central Venous Catheter Indications: Assessment of intravascular volume, Drug and/or fluid administration and Frequent blood sampling  Procedure Details Consent: Risks of procedure as well as the alternatives and risks of each were explained to the (patient/caregiver).  Consent for procedure obtained. and Unable to obtain consent because of altered level of consciousness. Time Out: Verified patient identification, verified procedure, site/side was marked, verified correct patient position, special equipment/implants available, medications/allergies/relevent history reviewed, required imaging and test results available.  Performed  Maximum sterile technique was used including antiseptics, cap, gloves, gown, hand hygiene, mask and sheet. Skin prep: Chlorhexidine; local anesthetic administered A antimicrobial bonded/coated triple lumen catheter was placed in the left subclavian vein using the Seldinger technique.  Evaluation Blood flow good Complications: No apparent complications Patient did tolerate procedure well. Chest X-ray ordered to verify placement.  CXR: normal.  SIQUEIROS, ALAN 08/17/2013, 11:13 PM

## 2013-08-17 NOTE — ED Notes (Signed)
Pt biting tube and clinched down. MD Belfi made aware.

## 2013-08-17 NOTE — ED Notes (Signed)
Propofol held at this time per MD Belfi, pt to get Fentanyl drip

## 2013-08-17 NOTE — ED Notes (Signed)
[facility] at [facility]

## 2013-08-17 NOTE — ED Notes (Signed)
[facility] awaiting to discuss case with CCM prior to leaving with patient.

## 2013-08-17 NOTE — Progress Notes (Signed)
ANTIBIOTIC CONSULT NOTE - INITIAL  Pharmacy Consult for Vancomycin/Zosyn Indication: pneumonia  No Known Allergies  Patient Measurements: Height: 5\' 10"  (177.8 cm) Weight: 173 lb 14.4 oz (78.881 kg) IBW/kg (Calculated) : 73  Vital Signs: Temp: 98.8 F (37.1 C) (06/27 2230) Temp src: Oral (06/27 2230) BP: 97/47 mmHg (06/27 2245) Pulse Rate: 97 (06/27 2245)  Labs:  Recent Labs  08/17/13 1835  WBC 8.1  HGB 14.8  PLT 263  CREATININE 0.90   Estimated Creatinine Clearance: 75.5 ml/min (by C-G formula based on Cr of 0.9).  Medical History: Past Medical History  Diagnosis Date  . Hypertension   . Diabetes mellitus without complication   . COPD (chronic obstructive pulmonary disease)     Assessment: 73 y/o M to start broad spectrum antibiotics for possible PNA. WBC wnl, renal function appropriate for age, lactic acid 2.15, other labs as above.   Goal of Therapy:  Vancomycin trough level 15-20 mcg/ml  Plan:  -Vancomycin 1000 mg IV q12h -Zosyn 3.375G IV q8h to be infused over 4 hours -Trend WBC, temp, renal function  -Drug levels as indicated   Abran DukeLedford, James 08/17/2013,11:05 PM

## 2013-08-18 ENCOUNTER — Inpatient Hospital Stay (HOSPITAL_COMMUNITY): Payer: Medicare Other

## 2013-08-18 ENCOUNTER — Encounter (HOSPITAL_COMMUNITY): Payer: Self-pay

## 2013-08-18 DIAGNOSIS — J9600 Acute respiratory failure, unspecified whether with hypoxia or hypercapnia: Secondary | ICD-10-CM

## 2013-08-18 DIAGNOSIS — I319 Disease of pericardium, unspecified: Secondary | ICD-10-CM

## 2013-08-18 DIAGNOSIS — A419 Sepsis, unspecified organism: Secondary | ICD-10-CM | POA: Diagnosis present

## 2013-08-18 DIAGNOSIS — R6521 Severe sepsis with septic shock: Secondary | ICD-10-CM

## 2013-08-18 DIAGNOSIS — J80 Acute respiratory distress syndrome: Secondary | ICD-10-CM | POA: Diagnosis present

## 2013-08-18 LAB — BLOOD GAS, ARTERIAL
ACID-BASE DEFICIT: 2.9 mmol/L — AB (ref 0.0–2.0)
BICARBONATE: 23.5 meq/L (ref 20.0–24.0)
DRAWN BY: 24513
FIO2: 100 %
LHR: 26 {breaths}/min
MECHVT: 440 mL
O2 SAT: 86.3 %
PEEP/CPAP: 18 cmH2O
PO2 ART: 61.2 mmHg — AB (ref 80.0–100.0)
Patient temperature: 98.6
TCO2: 25.3 mmol/L (ref 0–100)
pCO2 arterial: 57.1 mmHg (ref 35.0–45.0)
pH, Arterial: 7.238 — ABNORMAL LOW (ref 7.350–7.450)

## 2013-08-18 LAB — BASIC METABOLIC PANEL
BUN: 20 mg/dL (ref 6–23)
CHLORIDE: 95 meq/L — AB (ref 96–112)
CO2: 22 meq/L (ref 19–32)
Calcium: 7.6 mg/dL — ABNORMAL LOW (ref 8.4–10.5)
Creatinine, Ser: 0.99 mg/dL (ref 0.50–1.35)
GFR calc Af Amer: >90 mL/min (ref >90)
GFR calc non Af Amer: 79 mL/min — ABNORMAL LOW (ref >90)
Glucose, Bld: 214 mg/dL — ABNORMAL HIGH (ref 70–99)
Potassium: 4.3 mEq/L (ref 3.7–5.3)
Sodium: 129 mEq/L — ABNORMAL LOW (ref 137–147)

## 2013-08-18 LAB — POCT I-STAT 3, ART BLOOD GAS (G3+)
ACID-BASE DEFICIT: 7 mmol/L — AB (ref 0.0–2.0)
ACID-BASE DEFICIT: 8 mmol/L — AB (ref 0.0–2.0)
ACID-BASE DEFICIT: 3 mmol/L — AB (ref 0.0–2.0)
Acid-base deficit: 1 mmol/L (ref 0.0–2.0)
Acid-base deficit: 6 mmol/L — ABNORMAL HIGH (ref 0.0–2.0)
BICARBONATE: 25.6 meq/L — AB (ref 20.0–24.0)
BICARBONATE: 23.2 meq/L (ref 20.0–24.0)
Bicarbonate: 21.5 mEq/L (ref 20.0–24.0)
Bicarbonate: 20.2 mEq/L (ref 20.0–24.0)
Bicarbonate: 22.4 mEq/L (ref 20.0–24.0)
O2 SAT: 86 %
O2 SAT: 88 %
O2 Saturation: 86 %
O2 Saturation: 93 %
O2 Saturation: 93 %
PCO2 ART: 50.6 mmHg — AB (ref 35.0–45.0)
PCO2 ART: 54.5 mmHg — AB (ref 35.0–45.0)
PH ART: 7.314 — AB (ref 7.350–7.450)
PH ART: 7.227 — AB (ref 7.350–7.450)
PH ART: 7.299 — AB (ref 7.350–7.450)
PO2 ART: 57 mmHg — AB (ref 80.0–100.0)
Patient temperature: 98.8
Patient temperature: 100.4
Patient temperature: 98.8
TCO2: 27 mmol/L (ref 0–100)
TCO2: 23 mmol/L (ref 0–100)
TCO2: 22 mmol/L (ref 0–100)
TCO2: 24 mmol/L (ref 0–100)
TCO2: 25 mmol/L (ref 0–100)
pCO2 arterial: 54.6 mmHg — ABNORMAL HIGH (ref 35.0–45.0)
pCO2 arterial: 48.9 mmHg — ABNORMAL HIGH (ref 35.0–45.0)
pCO2 arterial: 47.4 mmHg — ABNORMAL HIGH (ref 35.0–45.0)
pH, Arterial: 7.207 — ABNORMAL LOW (ref 7.350–7.450)
pH, Arterial: 7.224 — ABNORMAL LOW (ref 7.350–7.450)
pO2, Arterial: 66 mmHg — ABNORMAL LOW (ref 80.0–100.0)
pO2, Arterial: 66 mmHg — ABNORMAL LOW (ref 80.0–100.0)
pO2, Arterial: 85 mmHg (ref 80.0–100.0)
pO2, Arterial: 76 mmHg — ABNORMAL LOW (ref 80.0–100.0)

## 2013-08-18 LAB — PHOSPHORUS
PHOSPHORUS: 3.4 mg/dL (ref 2.3–4.6)
Phosphorus: 2.7 mg/dL (ref 2.3–4.6)

## 2013-08-18 LAB — COMPREHENSIVE METABOLIC PANEL
ALBUMIN: 2.3 g/dL — AB (ref 3.5–5.2)
ALK PHOS: 42 U/L (ref 39–117)
ALT: 17 U/L (ref 0–53)
AST: 16 U/L (ref 0–37)
BILIRUBIN TOTAL: 0.6 mg/dL (ref 0.3–1.2)
BUN: 20 mg/dL (ref 6–23)
CO2: 26 mEq/L (ref 19–32)
Calcium: 7.2 mg/dL — ABNORMAL LOW (ref 8.4–10.5)
Chloride: 92 mEq/L — ABNORMAL LOW (ref 96–112)
Creatinine, Ser: 0.74 mg/dL (ref 0.50–1.35)
GFR calc non Af Amer: 89 mL/min — ABNORMAL LOW (ref >90)
GLUCOSE: 136 mg/dL — AB (ref 70–99)
POTASSIUM: 3.8 meq/L (ref 3.7–5.3)
Sodium: 129 mEq/L — ABNORMAL LOW (ref 137–147)
Total Protein: 4.6 g/dL — ABNORMAL LOW (ref 6.0–8.3)

## 2013-08-18 LAB — CBC
HCT: 36.9 % — ABNORMAL LOW (ref 39.0–52.0)
Hemoglobin: 12.7 g/dL — ABNORMAL LOW (ref 13.0–17.0)
MCH: 32.7 pg (ref 26.0–34.0)
MCHC: 34.4 g/dL (ref 30.0–36.0)
MCV: 95.1 fL (ref 78.0–100.0)
Platelets: 216 10*3/uL (ref 150–400)
RBC: 3.88 MIL/uL — ABNORMAL LOW (ref 4.22–5.81)
RDW: 13.9 % (ref 11.5–15.5)
WBC: 13.2 10*3/uL — ABNORMAL HIGH (ref 4.0–10.5)

## 2013-08-18 LAB — MAGNESIUM
MAGNESIUM: 1.6 mg/dL (ref 1.5–2.5)
Magnesium: 1.3 mg/dL — ABNORMAL LOW (ref 1.5–2.5)

## 2013-08-18 LAB — GLUCOSE, CAPILLARY
GLUCOSE-CAPILLARY: 203 mg/dL — AB (ref 70–99)
GLUCOSE-CAPILLARY: 189 mg/dL — AB (ref 70–99)
GLUCOSE-CAPILLARY: 151 mg/dL — AB (ref 70–99)
Glucose-Capillary: 104 mg/dL — ABNORMAL HIGH (ref 70–99)
Glucose-Capillary: 177 mg/dL — ABNORMAL HIGH (ref 70–99)
Glucose-Capillary: 184 mg/dL — ABNORMAL HIGH (ref 70–99)

## 2013-08-18 LAB — STREP PNEUMONIAE URINARY ANTIGEN: Strep Pneumo Urinary Antigen: NEGATIVE

## 2013-08-18 LAB — PRO B NATRIURETIC PEPTIDE: Pro B Natriuretic peptide (BNP): 571.3 pg/mL — ABNORMAL HIGH (ref 0–125)

## 2013-08-18 LAB — LACTIC ACID, PLASMA: Lactic Acid, Venous: 1.5 mmol/L (ref 0.5–2.2)

## 2013-08-18 LAB — PROCALCITONIN: Procalcitonin: 27.88 ng/mL

## 2013-08-18 LAB — CORTISOL: Cortisol, Plasma: 33.7 ug/dL

## 2013-08-18 LAB — TROPONIN I

## 2013-08-18 MED ORDER — CISATRACURIUM BOLUS VIA INFUSION
5.0000 mg | Freq: Once | INTRAVENOUS | Status: AC
  Administered 2013-08-18: 5 mg via INTRAVENOUS
  Filled 2013-08-18: qty 5

## 2013-08-18 MED ORDER — ARTIFICIAL TEARS OP OINT
1.0000 "application " | TOPICAL_OINTMENT | Freq: Three times a day (TID) | OPHTHALMIC | Status: DC
  Administered 2013-08-18 – 2013-08-20 (×5): 1 via OPHTHALMIC
  Filled 2013-08-18: qty 3.5

## 2013-08-18 MED ORDER — FENTANYL CITRATE 0.05 MG/ML IJ SOLN
100.0000 ug | Freq: Once | INTRAMUSCULAR | Status: AC
  Administered 2013-08-18: 100 ug via INTRAVENOUS

## 2013-08-18 MED ORDER — PANTOPRAZOLE SODIUM 40 MG IV SOLR
40.0000 mg | Freq: Every day | INTRAVENOUS | Status: DC
  Administered 2013-08-18 – 2013-08-23 (×6): 40 mg via INTRAVENOUS
  Filled 2013-08-18 (×8): qty 40

## 2013-08-18 MED ORDER — LEVALBUTEROL HCL 0.63 MG/3ML IN NEBU
0.6300 mg | INHALATION_SOLUTION | Freq: Four times a day (QID) | RESPIRATORY_TRACT | Status: DC
  Administered 2013-08-18 – 2013-08-19 (×5): 0.63 mg via RESPIRATORY_TRACT
  Filled 2013-08-18 (×9): qty 3

## 2013-08-18 MED ORDER — OXEPA PO LIQD
1000.0000 mL | ORAL | Status: DC
  Administered 2013-08-18: 1000 mL
  Filled 2013-08-18 (×3): qty 1000

## 2013-08-18 MED ORDER — SODIUM CHLORIDE 0.9 % IV BOLUS (SEPSIS)
1000.0000 mL | Freq: Once | INTRAVENOUS | Status: AC
  Administered 2013-08-18: 1000 mL via INTRAVENOUS

## 2013-08-18 MED ORDER — BIOTENE DRY MOUTH MT LIQD
15.0000 mL | Freq: Four times a day (QID) | OROMUCOSAL | Status: DC
  Administered 2013-08-18 – 2013-09-03 (×62): 15 mL via OROMUCOSAL

## 2013-08-18 MED ORDER — CHLORHEXIDINE GLUCONATE 0.12 % MT SOLN
15.0000 mL | Freq: Two times a day (BID) | OROMUCOSAL | Status: DC
  Administered 2013-08-18 – 2013-09-06 (×39): 15 mL via OROMUCOSAL
  Filled 2013-08-18 (×41): qty 15

## 2013-08-18 MED ORDER — FENTANYL CITRATE 0.05 MG/ML IJ SOLN
100.0000 ug | INTRAMUSCULAR | Status: DC | PRN
  Administered 2013-08-21 – 2013-08-23 (×4): 100 ug via INTRAVENOUS

## 2013-08-18 MED ORDER — FENTANYL BOLUS VIA INFUSION
50.0000 ug | INTRAVENOUS | Status: DC | PRN
  Administered 2013-08-23 (×3): 50 ug via INTRAVENOUS
  Filled 2013-08-18: qty 50

## 2013-08-18 MED ORDER — SODIUM CHLORIDE 0.9 % IV SOLN
INTRAVENOUS | Status: DC
  Administered 2013-08-18 (×2): via INTRAVENOUS
  Administered 2013-08-18: 100 mL/h via INTRAVENOUS
  Administered 2013-08-19: 10 mL/h via INTRAVENOUS
  Administered 2013-08-19 – 2013-08-27 (×3): via INTRAVENOUS

## 2013-08-18 MED ORDER — NOREPINEPHRINE BITARTRATE 1 MG/ML IV SOLN
2.0000 ug/min | INTRAVENOUS | Status: DC
  Administered 2013-08-18: 34 ug/min via INTRAVENOUS
  Administered 2013-08-20: 8 ug/min via INTRAVENOUS
  Filled 2013-08-18 (×3): qty 16

## 2013-08-18 MED ORDER — LEVALBUTEROL HCL 0.63 MG/3ML IN NEBU
0.6300 mg | INHALATION_SOLUTION | RESPIRATORY_TRACT | Status: DC | PRN
  Administered 2013-08-18: 0.63 mg via RESPIRATORY_TRACT
  Filled 2013-08-18: qty 3

## 2013-08-18 MED ORDER — MAGNESIUM SULFATE IN D5W 10-5 MG/ML-% IV SOLN
1.0000 g | Freq: Once | INTRAVENOUS | Status: AC
  Administered 2013-08-18: 1 g via INTRAVENOUS
  Filled 2013-08-18: qty 100

## 2013-08-18 MED ORDER — SODIUM CHLORIDE 0.9 % IV SOLN
0.0000 mg/h | INTRAVENOUS | Status: DC
  Administered 2013-08-19: 6 mg/h via INTRAVENOUS
  Administered 2013-08-19: 5 mg/h via INTRAVENOUS
  Administered 2013-08-20: 2 mg/h via INTRAVENOUS
  Administered 2013-08-20: 5 mg/h via INTRAVENOUS
  Administered 2013-08-22: 1 mg/h via INTRAVENOUS
  Filled 2013-08-18 (×6): qty 10

## 2013-08-18 MED ORDER — FENTANYL CITRATE 0.05 MG/ML IJ SOLN: 50.0000 ug | INTRAMUSCULAR | Status: DC | PRN

## 2013-08-18 MED ORDER — HYDROCORTISONE NA SUCCINATE PF 100 MG IJ SOLR
50.0000 mg | Freq: Four times a day (QID) | INTRAMUSCULAR | Status: DC
  Administered 2013-08-18 – 2013-08-19 (×5): 50 mg via INTRAVENOUS
  Filled 2013-08-18 (×11): qty 1

## 2013-08-18 MED ORDER — PROPOFOL 10 MG/ML IV EMUL: 5.0000 ug/kg/min | INTRAVENOUS | Status: DC

## 2013-08-18 MED ORDER — SODIUM CHLORIDE 0.9 % IV SOLN
25.0000 ug/h | INTRAVENOUS | Status: DC
  Administered 2013-08-18 – 2013-08-19 (×2): 280 ug/h via INTRAVENOUS
  Administered 2013-08-19: 250 ug/h via INTRAVENOUS
  Administered 2013-08-20: 100 ug/h via INTRAVENOUS
  Administered 2013-08-20: 250 ug/h via INTRAVENOUS
  Administered 2013-08-21: 100 ug/h via INTRAVENOUS
  Administered 2013-08-22 (×2): 200 ug/h via INTRAVENOUS
  Administered 2013-08-23: 300 ug/h via INTRAVENOUS
  Administered 2013-08-23: 275 ug/h via INTRAVENOUS
  Administered 2013-08-23: 200 ug/h via INTRAVENOUS
  Administered 2013-08-24: 300 ug/h via INTRAVENOUS
  Administered 2013-08-24: 150 ug/h via INTRAVENOUS
  Administered 2013-08-25: 75 ug/h via INTRAVENOUS
  Administered 2013-08-28: 50 ug/h via INTRAVENOUS
  Filled 2013-08-18 (×16): qty 50

## 2013-08-18 MED ORDER — SODIUM CHLORIDE 0.9 % IV SOLN
3.0000 ug/kg/min | INTRAVENOUS | Status: DC
  Administered 2013-08-18: 3 ug/kg/min via INTRAVENOUS
  Administered 2013-08-19: 6 ug/kg/min via INTRAVENOUS
  Administered 2013-08-19: 3 ug/kg/min via INTRAVENOUS
  Administered 2013-08-20: 4 ug/kg/min via INTRAVENOUS
  Filled 2013-08-18 (×4): qty 20

## 2013-08-18 MED ORDER — IPRATROPIUM BROMIDE 0.02 % IN SOLN
0.5000 mg | Freq: Four times a day (QID) | RESPIRATORY_TRACT | Status: DC
  Administered 2013-08-18 – 2013-08-19 (×6): 0.5 mg via RESPIRATORY_TRACT
  Filled 2013-08-18 (×6): qty 2.5

## 2013-08-18 MED ORDER — MIDAZOLAM BOLUS VIA INFUSION
1.0000 mg | INTRAVENOUS | Status: DC | PRN
  Administered 2013-08-21: 2 mg via INTRAVENOUS
  Filled 2013-08-18: qty 2

## 2013-08-18 NOTE — Progress Notes (Signed)
PULMONARY / CRITICAL CARE MEDICINE   Name: [name] [name] MRN: [id number] DOB: [birth year]    ADMISSION DATE:  08/17/2013  PRIMARY SERVICE: PCCM  CHIEF COMPLAINT:  Respiratory distress.   BRIEF PATIENT DESCRIPTION:   73 years old with DM, HTN and COPD. Pres[facility] to [facility] with acute hypoxemic respiratory failure. Initial oxygen saturation in the 20's, intubated in the ED. As per the records, he was admitted recently for 5 days with a COPD exacerbation and completed a course of Levaquin. Discharged 3 days ago. Since discharge more fatigued and progressively more SOB. He had an episode of vomiting. Unclear if he had fever today. At the time of my examination the patient is intubated, sedated, hypotensive with MAP of 55 to 60, saturating 83 to 88% on 100% FiO2 and PEEP of 5    SIGNIFICANT EVENTS / STUDIES:  6/27 - Admit w resp fx, cxr w bilateral patchy infiltrates.  [facility]  LINES / TUBES: ETT 6/27>>> Left subclavian CVC 6/27 >>>  CULTURES: Bood cultures 6/27 > Tracheal aspirate 6/27 > Urine culture 6/27 > Resp virus PCR 6/28 >> Urine leg 6/28 Urine strep 6/28  ANTIBIOTICS: - Recently completed levaquin for COPD exacerbation.  Zosyn 6/27>>> Vancomycin 6/27>>>   SUBJECTIVE:   -  RN reports up titration of levophed to maintain MAP, remains on 18 PEEP, 100% fiO2.   - PM rounds: ECHOef 65%. Very hypoxemic, sync with vent, On levophed 40mcg    VITAL SIGNS: Temp:  [98.4 F (36.9 C)-100.2 F (37.9 C)] 99.1 F (37.3 C) (06/28 0719) Pulse Rate:  [92-145] 118 (06/28 0700) Resp:  [14-35] 35 (06/28 0700) BP: (64-180)/(34-99) 98/74 mmHg (06/28 0700) SpO2:  [83 %-100 %] 92 % (06/28 0700) Arterial Line BP: (48-145)/(30-75) 99/60 mmHg (06/28 0700) FiO2 (%):  [80 %-100 %] 100 % (06/28 0611) Weight:  [173 lb 14.4 oz (78.881 kg)] 173 lb 14.4 oz (78.881 kg) (06/27 2012) HEMODYNAMICS: CVP:  [10 mmHg-12 mmHg] 10 mmHg VENTILATOR SETTINGS: Vent Mode:   [-] PRVC FiO2 (%):  [80 %-100 %] 100 % Set Rate:  [16 bmp-35 bmp] 35 bmp Vt Set:  [440 mL-600 mL] 440 mL PEEP:  [5 cmH20-18 cmH20] 18 cmH20 Plateau Pressure:  [16 cmH20-36 cmH20] 36 cmH20 INTAKE / OUTPUT: Intake/Output     06/27 0701 - 06/28 0700 06/28 0701 - 06/29 0700   I.V. (mL/kg) 2648.6 (33.6)    IV Piggyback 1237.5    Total Intake(mL/kg) 3886.1 (49.3)    Urine (mL/kg/hr) 735    Total Output 735     Net +3151.1            PHYSICAL EXAMINATION: General: Sedated, intubated, no acute distress. Eyes: Anicteric sclerae. Pupils are = / reactive ENT: ETT in place. Trachea at midline.  Lymph: No cervical, supraclavicular, or axillary lymphadenopathy. Heart: Normal S1, S2. No murmurs, rubs, or gallops appreciated. Equal pulses. Lungs: Diminished breath sounds bilaterally, without wheezes or crackles.  Abdomen: Abdomen soft, non-tender and not distended, normoactive bowel sounds. No hepatosplenomegaly or masses. Musculoskeletal: No clubbing or synovitis. No LE edema Skin: No rashes or lesions Neuro: Patient is unresponsive. Sedated  LABS: - updated 3pm 08/18/13  PULMONARY  Recent Labs Lab 08/17/13 2320 08/18/13 0204 08/18/13 0415 08/18/13 0608 08/18/13 0814  PHART 7.285* 7.314* 7.238* 7.207* 7.224*  PCO2ART 56.4* 50.6* 57.1* 54.6* 48.9*  PO2ART 59.7* 57.0* 61.2* 66.0* 66.0*  HCO3 26.0* 25.6* 23.5 21.5 20.2  TCO2 27.7 27 25.3 23 22   O2SAT  89.3 86.0 86.3 86.0 88.0    CBC  Recent Labs Lab 08/17/13 1835 08/18/13 0100  HGB 14.8 12.7*  HCT 41.8 36.9*  WBC 8.1 13.2*  PLT 263 216    COAGULATION  Recent Labs Lab 08/17/13 1915  INR 1.09    CARDIAC   Recent Labs Lab 08/17/13 1835  TROPONINI <0.30    Recent Labs Lab 08/18/13 0100  PROBNP 571.3*     CHEMISTRY  Recent Labs Lab 08/17/13 1835 08/18/13 0100  NA 122* 129*  K 6.3* 3.8  CL 78* 92*  CO2 31 26  GLUCOSE 226* 136*  BUN 24* 20  CREATININE 0.90 0.74  CALCIUM 9.1 7.2*  MG  --  1.3*   PHOS  --  2.7   Estimated Creatinine Clearance: 84.9 ml/min (by C-G formula based on Cr of 0.74).   LIVER  Recent Labs Lab 08/17/13 1835 08/17/13 1915 08/18/13 0100  AST 30  --  16  ALT 33  --  17  ALKPHOS 73  --  42  BILITOT 0.4  --  0.6  PROT 7.8  --  4.6*  ALBUMIN 4.0  --  2.3*  INR  --  1.09  --      INFECTIOUS  Recent Labs Lab 08/17/13 1855 08/18/13 0100  LATICACIDVEN 2.15 1.5     ENDOCRINE CBG (last 3)   Recent Labs  08/18/13 0343 08/18/13 0702 08/18/13 1109  GLUCAP 203* 177* 184*         IMAGING x48h  Ct Head Wo Contrast  08/17/2013   CLINICAL DATA:  Status post intubation with altered mental status.  EXAM: CT HEAD WITHOUT CONTRAST  TECHNIQUE: Contiguous axial images were obtained from the base of the skull through the vertex without intravenous contrast.  COMPARISON:  08/08/2013  FINDINGS: Sinuses/Soft tissues: Fluid and secretions in the nasopharynx. Trace fluid in the bilateral maxillary sinuses. No significant soft tissue swelling. Mucosal thickening of ethmoid air cells, new. Clear mastoid air cells.  Intracranial: Mild low density in the periventricular white matter likely related to small vessel disease. No mass lesion, hemorrhage, hydrocephalus, acute infarct, intra-axial, or extra-axial fluid collection.  IMPRESSION: 1.  No acute intracranial abnormality. 2. Mild small vessel ischemic change. 3. New mild sinus disease. This could at least partially be secondary to intubation.   Electronically Signed   By: Jeronimo Greaves M.D.   On: 08/17/2013 20:15   Dg Chest Port 1 View  08/17/2013   CLINICAL DATA:  Line placement  EXAM: PORTABLE CHEST - 1 VIEW  COMPARISON:  Chest x-ray from earlier the same day  FINDINGS: Interval placement of left subclavian central line. No complicating pneumothorax or visible hemothorax. The endotracheal tube remains in good position, tip at the mid thoracic trachea level. Orogastric tube ends in the mid stomach.  Stable heart  size and upper mediastinal contours.  Diffuse worsening of lung aeration, with fairly symmetric and perihilar predominant airspace disease.  IMPRESSION: 1. New left subclavian central line in good position. No pneumothorax. 2. Extensive bilateral lung opacity. Rapid worsening favors pulmonary edema (likely noncardiogenic), large volume aspiration, or pneumonia responding to hydration.   Electronically Signed   By: Tiburcio Pea M.D.   On: 08/17/2013 23:45   Dg Chest Port 1 View  08/17/2013   CLINICAL DATA:  Shortness of breath.  Status post intubation.  EXAM: PORTABLE CHEST - 1 VIEW  COMPARISON:  08/14/2013  FINDINGS: Endotracheal tube terminates 4.4 cm above carina. Nasogastric tube extends beyond the inferior  aspect of the film.  Normal heart size. Possible small left pleural effusion. No pneumothorax. New right upper lobe airspace disease. Similar left base volume loss.  IMPRESSION: Appropriate position of endotracheal tube.  New right upper Lobe airspace disease, suspicious for infection or aspiration  Possible small left pleural effusion with adjacent volume loss and atelectasis.   Electronically Signed   By: Jeronimo GreavesKyle  Talbot M.D.   On: 08/17/2013 19:27      ASSESSMENT / PLAN:  PULMONARY A: Acute hypoxemic and hypercarbic respiratory failure. Respiratory Acidosis  Bilateral patchy infiltrates compatible with ARDS, likely aspiration pneumonia given dark colored tracheal aspirate. COPD   - PM rounds: Severe ARDS - PF ratio 66  P:   - Mechanical ventilation per ARDS protocol - repeat Abg and cXR in PM: if PF ratio < 120 consider 48h nimbex +/- prone ventilation - VAP prevention order set - SBT/WUA once more stabilized  - Scheduled Duonebs - PRN albuterol nebs - Trend ABG  CARDIOVASCULAR A:  Hypotension, likely sepsis related  Negative troponin, borderline lactic acid, volume depleted on exam.   - CVP 9. Levophed at 40mcg  P:   - fluid bolus repeat in PM - empiric hydrocrt start  in PM - Levophed to keep MAP of 65 - Follow up ECHO >> - lactate improved, troponin negative - Aline for pressure monitoring   RENAL A:   Normal creatinine at admission Hyponatremia, likely volume depletion, poor oral intake. P:   - Continue IVF @ but increase to 12500ml/hr - Will follow CMP  GASTROINTESTINAL A:   N/V - prior to admit, concern for aspiration  P:   - Protonix for GI prophylaxis - gastric tube, begin TF 6/29 - Nutrition consult  HEMATOLOGIC A:   No issues P:  - Will follow CBC  INFECTIOUS A:   Aspiration pneumonia vs HCAP P:   - HCAP coverage as above - Follow cultures as above  ENDOCRINE A:   DM P:   - Novolog sliding scale per ICU hyperglycemia protocol  NEUROLOGIC A:   Acute Encephalopathy on ARDS protocol P:   - RASS goal: -1 to -2; change to RASS goal -3/-4 on ARDS protocol - Versed drip (profound hypotension with propofol) - Fentanyl drip  GLOBAL 6/28: no family at bedside  Canary BrimBrandi Ollis, NP-C [facility] Critical [facility] Pgr: [phone] or [phone]  08/18/2013, 7:38 AM   STAFF NOTE: I, Dr Lavinia SharpsM Ramaswamy have personally reviewed patient's available data, including medical history, events of note, physical examination and test results as part of my evaluation. I have discussed with resident/NP and other care providers such as pharmacist, RN and RRT.  In addition,  I personally evaluated patient and elicited key findings of Acute resp failure - ARDS. ARDS appears severe, Will recheck abg and if PF ratio is < 120 will consider prone ventilation +/- nimbex paralysis. Septic shock: will try fluid bolus to get CVP > 10.   Rest per NP/medical resident whose note is outlined above and that I agree with  The patient is critically ill with multiple organ systems failure and requires high complexity decision making for assessment and support, frequent evaluation and titration of therapies, application of advanced monitoring technologies and extensive  interpretation of multiple databases.   Critical Care Time devoted to patient care services described in this note is  35  Minutes.  Dr. Kalman ShanMurali Ramaswamy, M.D., [facility].C.P Pulmonary and Critical Care Medicine Staff Physician [facility] [place] Pulmonary and Critical Care Pager: [phone]  [phone], If no answer or between  15:00h - 7:00h: call [phone]  08/18/2013 3:26 PM

## 2013-08-18 NOTE — Progress Notes (Signed)
[facility]Link Physician-Brief Progress Note Patient Name: [name] [name] DOB: [birth year] MRN: [id number]  Date of Service  08/18/2013   HPI/Events of Note   Hypotenvise after gettting sedation  eICU Interventions  Will add maintenace fluid   Intervention Category Major Interventions: Other:  Johara Lodwick 08/18/2013, 12:29 AM

## 2013-08-18 NOTE — Progress Notes (Signed)
  Echocardiogram 2D Echocardiogram has been performed.  [name], [name] FRANCES 08/18/2013, 10:03 AM

## 2013-08-18 NOTE — Progress Notes (Signed)
  Recent Labs Lab 08/18/13 0204 08/18/13 0415 08/18/13 0608 08/18/13 0814 08/18/13 1534  PHART 7.314* 7.238* 7.207* 7.224* 7.227*  PCO2ART 50.6* 57.1* 54.6* 48.9* 54.5*  PO2ART 57.0* 61.2* 66.0* 66.0* 85.0  HCO3 25.6* 23.5 21.5 20.2 22.4  TCO2 27 25.3 23 22 24   O2SAT 86.0 86.3 86.0 88.0 93.0   PF ratio though improved still < 120. So decision taken earlier to Rx with nimbex x 48h.  No post nimbex abg yet  Plan Stat abg with results to eMD  Dr. Kalman ShanMurali Meshilem Machuca, M.D., [facility] System[facility].C.C.P Pulmonary and Critical Care Medicine Staff Physician [place] [facility] [place] Pulmonary and Critical Care Pager: [phone], If no answer or between  15:00h - 7:00h: call [phone]  08/18/2013 10:35 PM

## 2013-08-18 NOTE — Progress Notes (Signed)
eLink Physician-Brief Progress Note Patient Name: [name] [name] DOB: 07-09-1940 MRN: [id number]  Date of Service  08/18/2013   HPI/Events of Note   Hypotensive, tachycardic.  eICU Interventions  Will give fluid bolus 1 liter NS.   Intervention Category Major Interventions: Other:  SOOD,VINEET 08/18/2013, 2:10 AM

## 2013-08-18 NOTE — Progress Notes (Signed)
[facility]Link Physician-Brief Progress Note Patient Name: [name] BastaJasper [name] DOB: October 21, 1940 MRN: [id number]  Date of Service  08/18/2013   HPI/Events of Note   Low magnesium.  eICU Interventions  Will replace IV.   Intervention Category Major Interventions: Other:  SOOD,VINEET 08/18/2013, 2:27 AM

## 2013-08-18 NOTE — Procedures (Signed)
Arterial Catheter Insertion Procedure Note Vaughan BastaJasper Sehgal [id number] 15-May-1940  Procedure: Insertion of Arterial Catheter  Indications: Blood pressure monitoring and Frequent blood sampling  Procedure Details Consent: Risks of procedure as well as the alternatives and risks of each were explained to the (patient/caregiver).  Consent for procedure obtained. Time Out: Verified patient identification, verified procedure, site/side was marked, verified correct patient position, special equipment/implants available, medications/allergies/relevent history reviewed, required imaging and test results available.  Performed  Maximum sterile technique was used including antiseptics, cap, gloves, gown, hand hygiene, mask and sheet. Skin prep: Chlorhexidine; local anesthetic administered 20 gauge catheter was inserted into right radial artery using the Seldinger technique.  Evaluation Blood flow good; BP tracing good. Complications: No apparent complications.   Clearnce SorrelCoro, Craig Mcleod Seacoasthawn 08/18/2013

## 2013-08-19 ENCOUNTER — Inpatient Hospital Stay (HOSPITAL_COMMUNITY): Payer: Medicare Other

## 2013-08-19 DIAGNOSIS — A419 Sepsis, unspecified organism: Secondary | ICD-10-CM

## 2013-08-19 DIAGNOSIS — R6521 Severe sepsis with septic shock: Secondary | ICD-10-CM

## 2013-08-19 DIAGNOSIS — J9600 Acute respiratory failure, unspecified whether with hypoxia or hypercapnia: Secondary | ICD-10-CM

## 2013-08-19 LAB — LEGIONELLA ANTIGEN, URINE: LEGIONELLA ANTIGEN, URINE: NEGATIVE

## 2013-08-19 LAB — URINE CULTURE
COLONY COUNT: NO GROWTH
Culture: NO GROWTH

## 2013-08-19 LAB — PROCALCITONIN: PROCALCITONIN: 15.91 ng/mL

## 2013-08-19 LAB — GLUCOSE, CAPILLARY
GLUCOSE-CAPILLARY: 204 mg/dL — AB (ref 70–99)
GLUCOSE-CAPILLARY: 174 mg/dL — AB (ref 70–99)
GLUCOSE-CAPILLARY: 123 mg/dL — AB (ref 70–99)
Glucose-Capillary: 127 mg/dL — ABNORMAL HIGH (ref 70–99)
Glucose-Capillary: 169 mg/dL — ABNORMAL HIGH (ref 70–99)
Glucose-Capillary: 196 mg/dL — ABNORMAL HIGH (ref 70–99)
Glucose-Capillary: 203 mg/dL — ABNORMAL HIGH (ref 70–99)

## 2013-08-19 LAB — HEPATIC FUNCTION PANEL
ALBUMIN: 1.9 g/dL — AB (ref 3.5–5.2)
ALT: 14 U/L (ref 0–53)
AST: 12 U/L (ref 0–37)
Alkaline Phosphatase: 148 U/L — ABNORMAL HIGH (ref 39–117)
BILIRUBIN TOTAL: 0.3 mg/dL (ref 0.3–1.2)
Bilirubin, Direct: <0.2 mg/dL (ref 0.0–0.3)
Total Protein: 4.9 g/dL — ABNORMAL LOW (ref 6.0–8.3)

## 2013-08-19 LAB — BASIC METABOLIC PANEL
BUN: 16 mg/dL (ref 6–23)
CALCIUM: 7.8 mg/dL — AB (ref 8.4–10.5)
CO2: 22 mEq/L (ref 19–32)
Chloride: 97 mEq/L (ref 96–112)
Creatinine, Ser: 0.89 mg/dL (ref 0.50–1.35)
GFR calc Af Amer: >90 mL/min (ref >90)
GFR, EST NON AFRICAN AMERICAN: 83 mL/min — AB (ref >90)
Glucose, Bld: 237 mg/dL — ABNORMAL HIGH (ref 70–99)
Potassium: 3.9 mEq/L (ref 3.7–5.3)
Sodium: 132 mEq/L — ABNORMAL LOW (ref 137–147)

## 2013-08-19 LAB — TROPONIN I

## 2013-08-19 LAB — POCT I-STAT 3, ART BLOOD GAS (G3+)
ACID-BASE DEFICIT: 2 mmol/L (ref 0.0–2.0)
Bicarbonate: 23.9 mEq/L (ref 20.0–24.0)
O2 SAT: 96 %
Patient temperature: 98.7
TCO2: 25 mmol/L (ref 0–100)
pCO2 arterial: 42.8 mmHg (ref 35.0–45.0)
pH, Arterial: 7.355 (ref 7.350–7.450)
pO2, Arterial: 89 mmHg (ref 80.0–100.0)

## 2013-08-19 LAB — CBC WITH DIFFERENTIAL/PLATELET
Basophils Absolute: 0 10*3/uL (ref 0.0–0.1)
Basophils Relative: 0 % (ref 0–1)
EOS ABS: 0 10*3/uL (ref 0.0–0.7)
EOS PCT: 0 % (ref 0–5)
HEMATOCRIT: 31.2 % — AB (ref 39.0–52.0)
Hemoglobin: 10.6 g/dL — ABNORMAL LOW (ref 13.0–17.0)
LYMPHS ABS: 0.6 10*3/uL — AB (ref 0.7–4.0)
Lymphocytes Relative: 3 % — ABNORMAL LOW (ref 12–46)
MCH: 32.5 pg (ref 26.0–34.0)
MCHC: 34 g/dL (ref 30.0–36.0)
MCV: 95.7 fL (ref 78.0–100.0)
MONO ABS: 0.9 10*3/uL (ref 0.1–1.0)
Monocytes Relative: 4 % (ref 3–12)
Neutro Abs: 20.6 10*3/uL — ABNORMAL HIGH (ref 1.7–7.7)
Neutrophils Relative %: 93 % — ABNORMAL HIGH (ref 43–77)
Platelets: 183 10*3/uL (ref 150–400)
RBC: 3.26 MIL/uL — ABNORMAL LOW (ref 4.22–5.81)
RDW: 14.7 % (ref 11.5–15.5)
WBC: 22.2 10*3/uL — AB (ref 4.0–10.5)

## 2013-08-19 LAB — CBC
HCT: 30.6 % — ABNORMAL LOW (ref 39.0–52.0)
Hemoglobin: 10.6 g/dL — ABNORMAL LOW (ref 13.0–17.0)
MCH: 32.6 pg (ref 26.0–34.0)
MCHC: 34.6 g/dL (ref 30.0–36.0)
MCV: 94.2 fL (ref 78.0–100.0)
PLATELETS: 186 10*3/uL (ref 150–400)
RBC: 3.25 MIL/uL — ABNORMAL LOW (ref 4.22–5.81)
RDW: 14.3 % (ref 11.5–15.5)
WBC: 21.5 10*3/uL — AB (ref 4.0–10.5)

## 2013-08-19 LAB — CORTISOL: CORTISOL PLASMA: 31.1 ug/dL

## 2013-08-19 MED ORDER — OXEPA PO LIQD
1000.0000 mL | ORAL | Status: DC
  Administered 2013-08-19 – 2013-08-21 (×3): 1000 mL
  Filled 2013-08-19 (×4): qty 1000

## 2013-08-19 MED ORDER — SODIUM CHLORIDE 0.9 % IV SOLN
500.0000 mg | Freq: Four times a day (QID) | INTRAVENOUS | Status: DC
  Administered 2013-08-19 – 2013-08-21 (×8): 500 mg via INTRAVENOUS
  Filled 2013-08-19 (×10): qty 500

## 2013-08-19 MED ORDER — PRO-STAT SUGAR FREE PO LIQD: 30.0000 mL | Freq: Four times a day (QID) | ORAL | Status: DC

## 2013-08-19 MED ORDER — IOHEXOL 300 MG/ML  SOLN
50.0000 mL | Freq: Once | INTRAMUSCULAR | Status: AC | PRN
  Administered 2013-08-19: 30 mL

## 2013-08-19 MED ORDER — PRO-STAT SUGAR FREE PO LIQD
30.0000 mL | Freq: Four times a day (QID) | ORAL | Status: DC
  Administered 2013-08-20 – 2013-08-27 (×33): 30 mL
  Filled 2013-08-19 (×37): qty 30

## 2013-08-19 MED ORDER — OXEPA PO LIQD
1000.0000 mL | ORAL | Status: DC
  Filled 2013-08-19: qty 1000

## 2013-08-19 NOTE — Progress Notes (Signed)
ANTIBIOTIC CONSULT NOTE - Follow Up   Pharmacy Consult for Vancomycin + Imipenem  Indication: pneumonia  Allergies  Allergen Reactions  . Lisinopril Other (See Comments)    Unknown reaction    Patient Measurements: Height: 5\' 10"  (177.8 cm) Weight: 172 lb 9.9 oz (78.3 kg) IBW/kg (Calculated) : 73  Vital Signs: Temp: 98.5 F (36.9 C) (06/29 0745) Temp src: Core (Comment) (06/29 0800) BP: 108/55 mmHg (06/29 0700) Pulse Rate: 101 (06/29 0745)  Labs:  Recent Labs  08/17/13 1835 08/18/13 0100 08/18/13 1520 08/19/13 0426  WBC 8.1 13.2*  --  21.5*  HGB 14.8 12.7*  --  10.6*  PLT 263 216  --  186  CREATININE 0.90 0.74 0.99 0.89   Estimated Creatinine Clearance: 76.3 ml/min (by C-G formula based on Cr of 0.89).  Medical History: Past Medical History  Diagnosis Date  . Hypertension   . Diabetes mellitus without complication   . COPD (chronic obstructive pulmonary disease)     Assessment: 73 y/o M started broad spectrum antibiotics for possible PNA. WBC wnl, renal function appropriate for age, lactic acid 2.15. Patient remains febrile and WBC has continued to trend up today. Switching Zosyn to imipenem today to expand antibiotic coverage   Goal of Therapy:  Vancomycin trough level 15-20 mcg/ml  Plan:  -Vancomycin 1000 mg IV q12h -Stop Zosyn -Start Imipenem 500 mg IV Q 6 hours  -Trend WBC, temp, renal function  -Drug levels as indicated  -F/u VT today   Vinnie LevelBenjamin Mancheril, PharmD.  Clinical Pharmacist Pager [phone]

## 2013-08-19 NOTE — Progress Notes (Signed)
INITIAL NUTRITION ASSESSMENT  DOCUMENTATION CODES Per approved criteria  -Not Applicable   INTERVENTION:  Utilize 20M PEPuP Protocol: continue TF via OGT, transition to post-pyloric feeding tube with Oxepa at 45 ml/h (1080 ml per day) and Prostat 30 ml QID to provide 2020 kcals, 128 gm protein, 848 ml free water daily.  NUTRITION DIAGNOSIS: Inadequate oral intake related to inability to eat as evidenced by NPO status.   Goal: Intake to meet >90% of estimated nutrition needs.  Monitor:  TF tolerance/adequacy, weight trend, labs, vent status.  Reason for Assessment: MD Consult for TF initiation and management.  73 y.o. male  Admitting Dx: Respiratory distress  ASSESSMENT: 73 year old male with DM, HTN and COPD. [facility] with acute hypoxemic respiratory failure. Being managed in ICU for septic shock, ARDS.  Patient's wife and son report that patient was eating poorly at home PTA because they were trying to follow a strict low sodium, diabetic diet. He ate very well [facility] a few weeks ago. No weight loss noted over the past year.  Per discussion with RN, patient is currently receiving TF via OGT at 40 ml/h. Plans to place post-pyloric feeding tube today, then place patient in a prone position. Patient is tolerating Oxepa at 40 ml/h well at this time, providing 1440 kcals, 60 gm protein, 754 ml free water daily.  Patient is currently intubated on ventilator support MV: 15.1 L/min Temp (24hrs), Avg:99.1 F (37.3 C), Min:98.4 F (36.9 C), Max:100.5 F (38.1 C)   Height: Ht Readings from Last 1 Encounters:  08/17/13 5\' 10"  (1.778 m)    Weight: Wt Readings from Last 1 Encounters:  08/19/13 172 lb 9.9 oz (78.3 kg)    Ideal Body Weight: 75.5 kg  % Ideal Body Weight: 104%  Wt Readings from Last 10 Encounters:  08/19/13 172 lb 9.9 oz (78.3 kg)    Usual Body Weight: ~170 lb per son  % Usual Body  Weight: 100%  BMI:  Body mass index is 24.77 kg/(m^2).  Estimated Nutritional Needs: Kcal: 2100 Protein: 110-125 gm Fluid: 2.1 L  Skin: 2 stage 2 pressure ulcers to back  Diet Order: NPO  EDUCATION NEEDS: -Education not appropriate at this time   Intake/Output Summary (Last 24 hours) at 08/19/13 1016 Last data filed at 08/19/13 0800  Gross per 24 hour  Intake 5358.82 ml  Output   2085 ml  Net 3273.82 ml    Last BM: 6/27   Labs:   Recent Labs Lab 08/17/13 1835 08/18/13 0100 08/18/13 1520 08/19/13 0426  NA 122* 129* 129* 132*  K 6.3* 3.8 4.3 3.9  CL 78* 92* 95* 97  CO2 31 26 22 22   BUN 24* 20 20 16   CREATININE 0.90 0.74 0.99 0.89  CALCIUM 9.1 7.2* 7.6* 7.8*  MG  --  1.3* 1.6  --   PHOS  --  2.7 3.4  --   GLUCOSE 226* 136* 214* 237*    CBG (last 3)   Recent Labs  08/18/13 2342 08/19/13 0352 08/19/13 0809  GLUCAP 127* 196* 203*    Scheduled Meds: . antiseptic oral rinse  15 mL Mouth Rinse QID  . artificial tears  1 application Both Eyes 3 times per day  . chlorhexidine  15 mL Mouth Rinse BID  . feeding supplement (OXEPA)  1,000 mL Per Tube Q24H  . heparin  5,000 Units Subcutaneous 3 times per day  . hydrocortisone sod  succinate (SOLU-CORTEF) inj  50 mg Intravenous Q6H  . imipenem-cilastatin  500 mg Intravenous 4 times per day  . insulin aspart  2-6 Units Subcutaneous 6 times per day  . pantoprazole (PROTONIX) IV  40 mg Intravenous QHS  . vancomycin  1,000 mg Intravenous Q12H    Continuous Infusions: . sodium chloride 100 mL/hr at 08/19/13 0618  . cisatracurium (NIMBEX) infusion 3 mcg/kg/min (08/19/13 0751)  . fentaNYL infusion INTRAVENOUS 280 mcg/hr (08/19/13 0724)  . midazolam (VERSED) infusion 6 mg/hr (08/19/13 0600)  . norepinephrine (LEVOPHED) Adult infusion 24 mcg/min (08/19/13 0645)    Past Medical History  Diagnosis Date  . Hypertension   . Diabetes mellitus without complication   . COPD (chronic obstructive pulmonary disease)      History reviewed. No pertinent past surgical history.   Joaquin CourtsKimberly Harris, RD, LDN, CNSC Pager [phone] After Hours Pager [phone]

## 2013-08-19 NOTE — Progress Notes (Signed)
PULMONARY / CRITICAL CARE MEDICINE   Name: [name] [name] MRN: [id number] DOB: 09/01/1940    ADMISSION DATE:  08/17/2013  PRIMARY SERVICE: PCCM  CHIEF COMPLAINT:  Respiratory distress.   BRIEF PATIENT DESCRIPTION:   73 years old with DM, HTN and COPD. Presents to [facility] with acute hypoxemic respiratory failure. Being manaded in ICU for Septic shock, ARDS.  SIGNIFICANT EVENTS / STUDIES:  6/27 - Admit w resp fx, cxr w bilateral patchy infiltrates.  Intubated & tx to [facility] 6/29- paralyzed, peep 12, pressors required  LINES / TUBES: ETT 6/27>>> Left subclavian CVC 6/27 >>> 6/28 rt rad a line>>>  CULTURES: Bood cultures 6/27 > Tracheal aspirate 6/27 > Urine culture 6/27 > Resp virus PCR 6/28 >> Urine leg 6/28 Urine strep 6/28  ANTIBIOTICS: - Recently completed levaquin for COPD exacerbation.  Zosyn 6/27>>>6/29 6/29 Imipenem>>> Vancomycin 6/27>>>  SUBJECTIVE: Pee 12 remains, 24 levophed, cvp 11  VITAL SIGNS: Temp:  [98.4 F (36.9 C)-100.5 F (38.1 C)] 98.5 F (36.9 C) (06/29 0745) Pulse Rate:  [98-135] 101 (06/29 0745) Resp:  [0-35] 35 (06/29 0745) BP: (88-136)/(50-78) 108/55 mmHg (06/29 0700) SpO2:  [92 %-100 %] 99 % (06/29 0845) Arterial Line BP: (49-143)/(34-84) 104/55 mmHg (06/29 0745) FiO2 (%):  [70 %-100 %] 70 % (06/29 0846) Weight:  [78.3 kg (172 lb 9.9 oz)] 78.3 kg (172 lb 9.9 oz) (06/29 0400) HEMODYNAMICS: CVP:  [8 mmHg-12 mmHg] 11 mmHg VENTILATOR SETTINGS: Vent Mode:  [-] PRVC FiO2 (%):  [70 %-100 %] 70 % Set Rate:  [35 bmp] 35 bmp Vt Set:  [440 mL] 440 mL PEEP:  [12 cmH20-18 cmH20] 12 cmH20 Plateau Pressure:  [27 cmH20-41 cmH20] 27 cmH20 INTAKE / OUTPUT: Intake/Output     06/28 0701 - 06/29 0700 06/29 0701 - 06/30 0700   I.V. (mL/kg) 4064.1 (51.9) 170.7 (2.2)   NG/GT 920 40   IV Piggyback 500    Total Intake(mL/kg) 5484.1 (70) 210.7 (2.7)   Urine (mL/kg/hr) 2120 (1.1) 175 (1.1)   Emesis/NG output 70 (0)    Total Output 2190  175   Net +3294.1 +35.7          PHYSICAL EXAMINATION: General: rass -5 Neuro: paralyzed, rass -5, perr 2 HEENT: jvd noted PULM: coarse diffuse CV: s1 s2 tachy rr no m GI: soft , bs wnl , no r Extremities: no edema   LABS: - updated 3pm 08/18/13  PULMONARY  Recent Labs Lab 08/18/13 0415 08/18/13 0608 08/18/13 0814 08/18/13 1534 08/18/13 2247  PHART 7.238* 7.207* 7.224* 7.227* 7.299*  PCO2ART 57.1* 54.6* 48.9* 54.5* 47.4*  PO2ART 61.2* 66.0* 66.0* 85.0 76.0*  HCO3 23.5 21.5 20.2 22.4 23.2  TCO2 25._0 O2SAT 86.3 86.0 88.0 93.0 93.0    CBC  Recent Labs Lab 08/17/13 1835 08/18/13 0100 08/19/13 0426  HGB 14.8 12.7* 10.6*  HCT 41.8 36.9* 30.6*  WBC 8.1 13.2* 21.5*  PLT 263 216 186    COAGULATION  Recent Labs Lab 08/17/13 1915  INR 1.09    CARDIAC    Recent Labs Lab 08/17/13 1835 08/18/13 1525 08/19/13 0426  TROPONINI <0.30 <0.30 <0.30    Recent Labs Lab 08/18/13 0100  PROBNP 571.3*     CHEMISTRY  Recent Labs Lab 08/17/13 1835 08/18/13 0100 08/18/13 1520 08/19/13 0426  NA 122* 129* 129* 132*  K 6.3* 3.8 4.3 3.9  CL 78* 92* 95* 97  CO2 _1 GLUCOSE 226* 136* 214* 237*  BUN 24* _0 CREATININE 0.90 0.74 0.99 0.89  CALCIUM 9.1 7.2* 7.6* 7.8*  MG  --  1.3* 1.6  --   PHOS  --  2.7 3.4  --    Estimated Creatinine Clearance: 76.3 ml/min (by C-G formula based on Cr of 0.89).   LIVER  Recent Labs Lab 08/17/13 1835 08/17/13 1915 08/18/13 0100 08/19/13 0426  AST 30  --  16 12  ALT 33  --  17 14  ALKPHOS 73  --  42 148*  BILITOT 0.4  --  0.6 0.3  PROT 7.8  --  4.6* 4.9*  ALBUMIN 4.0  --  2.3* 1.9*  INR  --  1.09  --   --      INFECTIOUS  Recent Labs Lab 08/17/13 1855 08/18/13 0100 08/18/13 1517 08/19/13 0426  LATICACIDVEN 2.15 1.5  --   --   PROCALCITON  --   --  27.88 15.91     ENDOCRINE CBG (last 3)   Recent Labs  08/18/13 2342 08/19/13 0352 08/19/13 0809  GLUCAP 127* 196* 203*          IMAGING x48h  Ct Head Wo Contrast  08/17/2013   CLINICAL DATA:  Status post intubation with altered mental status.  EXAM: CT HEAD WITHOUT CONTRAST  TECHNIQUE: Contiguous axial images were obtained from the base of the skull through the vertex without intravenous contrast.  COMPARISON:  08/08/2013  FINDINGS: Sinuses/Soft tissues: Fluid and secretions in the nasopharynx. Trace fluid in the bilateral maxillary sinuses. No significant soft tissue swelling. Mucosal thickening of ethmoid air cells, new. Clear mastoid air cells.  Intracranial: Mild low density in the periventricular white matter likely related to small vessel disease. No mass lesion, hemorrhage, hydrocephalus, acute infarct, intra-axial, or extra-axial fluid collection.  IMPRESSION: 1.  No acute intracranial abnormality. 2. Mild small vessel ischemic change. 3. New mild sinus disease. This could at least partially be secondary to intubation.   Electronically Signed   By: Abigail Miyamoto M.D.   On: 08/17/2013 20:15   Dg Chest Port 1 View  08/19/2013   CLINICAL DATA:  Check endotracheal tube position  EXAM: PORTABLE CHEST - 1 VIEW  COMPARISON:  08/18/2013  FINDINGS: Endotracheal tube is noted 4.8 cm above the carina. A nasogastric catheter is seen extending into the stomach. A left subclavian line is again noted and unchanged in the mid superior vena cava. The cardiac shadow is stable. Patchy infiltrates are again noted bilaterally. No acute abnormality is noted.  IMPRESSION: Tubes and lines as described above.  Stable bilateral infiltrates.   Electronically Signed   By: Inez Catalina M.D.   On: 08/19/2013 07:48   Dg Chest Port 1 View  08/18/2013   CLINICAL DATA:  ARDS  EXAM: PORTABLE CHEST - 1 VIEW  COMPARISON:  08/17/2013  FINDINGS: Cardiomediastinal silhouette is stable. Stable NG tube endotracheal tube position. Stable left subclavian central line. No pneumothorax. Again noted patchy bilateral airspace is right greater than left  without change in aeration. Again for findings may be due to bilateral infiltrates, ARDS, or noncardiogenic edema.  IMPRESSION: Stable support apparatus. No pneumothorax. Again noted patchy bilateral airspace disease without change in aeration, right greater than left.   Electronically Signed   By: Lahoma Crocker M.D.   On: 08/18/2013 15:59   Dg Chest Port 1 View  08/17/2013   CLINICAL DATA:  Line placement  EXAM: PORTABLE CHEST - 1 VIEW  COMPARISON:  Chest x-ray from earlier  the same day  FINDINGS: Interval placement of left subclavian central line. No complicating pneumothorax or visible hemothorax. The endotracheal tube remains in good position, tip at the mid thoracic trachea level. Orogastric tube ends in the mid stomach.  Stable heart size and upper mediastinal contours.  Diffuse worsening of lung aeration, with fairly symmetric and perihilar predominant airspace disease.  IMPRESSION: 1. New left subclavian central line in good position. No pneumothorax. 2. Extensive bilateral lung opacity. Rapid worsening favors pulmonary edema (likely noncardiogenic), large volume aspiration, or pneumonia responding to hydration.   Electronically Signed   By: Jorje Guild M.D.   On: 08/17/2013 23:45   Dg Chest Port 1 View  08/17/2013   CLINICAL DATA:  Shortness of breath.  Status post intubation.  EXAM: PORTABLE CHEST - 1 VIEW  COMPARISON:  08/14/2013  FINDINGS: Endotracheal tube terminates 4.4 cm above carina. Nasogastric tube extends beyond the inferior aspect of the film.  Normal heart size. Possible small left pleural effusion. No pneumothorax. New right upper lobe airspace disease. Similar left base volume loss.  IMPRESSION: Appropriate position of endotracheal tube.  New right upper Lobe airspace disease, suspicious for infection or aspiration  Possible small left pleural effusion with adjacent volume loss and atelectasis.   Electronically Signed   By: Abigail Miyamoto M.D.   On: 08/17/2013 19:27      ASSESSMENT  / PLAN:  PULMONARY A: Acute hypoxemic and hypercarbic respiratory failure. Respiratory Acidosis  Bilateral patchy infiltrates compatible with ARDS, likely aspiration pneumonia given dark colored tracheal aspirate. COPD  P:   - abg reviewed likely can lower fio2 to goal 60% -given copd, noted autopeep, will have permissive hypercapnia, reduce rate, repeat abg, will d/w rt to max peak flow rate -goal ph 7.20, if ph less 7.25 with ards, then add bicarb -avoid such gros pos balance, see renal - SBT unable , see above - Scheduled Duonebs, no wheezing, not a home med, can worsen  ards- use only prn  - PRN albuterol nebs -consider prone position, will ass abg on 60% peep 12, likely will require, goal 16 hrs, then 4 hour abg  CARDIOVASCULAR A:  Hypotension, likely sepsis related  And autopeep cvp now 11  P:   -levophed to map 60 -cvp 11, consider kvo -even to slight pos goals -maintain steroids -echo assess reviewed -if tachy, add vaso  RENAL A:   Normal creatinine at admission Hyponatremia, likely volume depletion, poor oral intake. P:   -cvp 11, pos 4 liters last 24 hrs, ards - kvo cmet in am   GASTROINTESTINAL A:   N/V - prior to admit, concern for aspiration  P:   - Protonix for GI prophylaxis - gastric tube, begin TF 6/29 - to goal  -consider oxepa - Nutrition consult  HEMATOLOGIC A:   DVT prevention, leukocytosis P:  - Will follow CBC, wbc with diff -sub q hep  INFECTIOUS A:   Aspiration pneumonia vs HCAP P:   -lack of clinical progress -dc zosyn, add imipenem -cont vanc -Bronch bal if vent allows in future  ENDOCRINE A:   DM P:   - Novolog sliding scale per ICU hyperglycemia protocol -NICE trial goals met  NEUROLOGIC A:   Acute Encephalopathy on ARDS protocol, severe ARDS  P:   - RASS goal: -4 to -5, paralysis still required - Versed drip required - Fentanyl drip  GLOBAL Family updated, for prone position  Ccm time 45 min  Lavon Paganini.  Titus Mould, MD, [place] Pgr: [phone]  [place] [facility]

## 2013-08-19 NOTE — Progress Notes (Signed)
Inpatient Diabetes Program Recommendations  AACE/ADA: New Consensus Statement on Inpatient Glycemic Control (2013)  Target Ranges:  Prepandial:   less than 140 mg/dL      Peak postprandial:   less than 180 mg/dL (1-2 hours)      Critically ill patients:  140 - 180 mg/dL   Reason for Assessment:Results for [name] [name], [name] [name] (MRN [id number]) as of 08/19/2013 13:26  Ref. Range 08/18/2013 19:24 08/18/2013 23:42 08/19/2013 03:52 08/19/2013 08:09 08/19/2013 10:45  Glucose-Capillary Latest Range: 70-99 mg/dL 119151 (H) 147127 (H) 829196 (H) 203 (H) 204 (H)     Diabetes history: Type 2 diabetes Outpatient Diabetes medications: Levemir 10 units q HS, Metformin 500 mg bid Current orders for Inpatient glycemic control: ICU Glycemic control protocol  Note CBG's > 200 mg/dL.  Consider IV insulin per protocol.  If IV insulin not started, may consider restarting home dose of Levemir 10 units daily.  Beryl MeagerJenny Simpson, RN, BC-ADM Inpatient Diabetes Coordinator Pager [phone]

## 2013-08-19 NOTE — Progress Notes (Signed)
PULMONARY / CRITICAL CARE MEDICINE   Name: [name] [name] MRN: [id number] DOB: 06-06-1940    ADMISSION DATE:  08/17/2013  PRIMARY SERVICE: PCCM  CHIEF COMPLAINT:  Respiratory distress.   BRIEF PATIENT DESCRIPTION:   73 years old with DM, HTN and COPD. Presents to [facility] with acute hypoxemic respiratory failure. Being managed in ICU for Septic shock, ARDS.  SIGNIFICANT EVENTS / STUDIES:  6/27 - Admit w resp fx, cxr w bilateral patchy infiltrates.  Intubated & tx to [facility] 6/29- paralyzed, peep 12, pressors required  LINES / TUBES: ETT 6/27>>> Left subclavian CVC 6/27 >>> 6/28 rt rad a line>>>  CULTURES: Bood cultures 6/27 > Tracheal aspirate 6/27 > Urine culture 6/27 > Resp virus PCR 6/28 >> Urine leg 6/28 Urine strep 6/28  ANTIBIOTICS: - Recently completed levaquin for COPD exacerbation.  Zosyn 6/27>>>6/29 6/29 Imipenem>>> Vancomycin 6/27>>>  SUBJECTIVE: Afebrile Sedated, apralysed PEEP down to 10/ FIO2 50%  VITAL SIGNS: Temp:  [97.7 F (36.5 C)-99.1 F (37.3 C)] 97.7 F (36.5 C) (06/29 1705) Pulse Rate:  [78-125] 78 (06/29 1800) Resp:  [28-35] 28 (06/29 1800) BP: (98-138)/(50-78) 121/54 mmHg (06/29 1800) SpO2:  [92 %-100 %] 98 % (06/29 1700) Arterial Line BP: (78-153)/(46-79) 110/54 mmHg (06/29 1730) FiO2 (%):  [50 %-80 %] 50 % (06/29 1603) Weight:  [78.3 kg (172 lb 9.9 oz)] 78.3 kg (172 lb 9.9 oz) (06/29 0400) HEMODYNAMICS: CVP:  [8 mmHg-12 mmHg] 8 mmHg VENTILATOR SETTINGS: Vent Mode:  [-] PRVC FiO2 (%):  [50 %-80 %] 50 % Set Rate:  [35 bmp] 35 bmp Vt Set:  [440 mL] 440 mL PEEP:  [10 cmH20-16 cmH20] 10 cmH20 Plateau Pressure:  [25 cmH20-33 cmH20] 26 cmH20 INTAKE / OUTPUT: Intake/Output     06/29 0701 - 06/30 0700   I.V. (mL/kg) 988.6 (12.6)   NG/GT 240   IV Piggyback 300   Total Intake(mL/kg) 1528.6 (19.5)   Urine (mL/kg/hr) 450 (0.4)   Emesis/NG output    Total Output 450   Net +1078.6         PHYSICAL  EXAMINATION: General: rass -5 Neuro: paralyzed,sedated, perr 2 HEENT: jvd noted PULM: coarse diffuse rales, synchronous CV: s1 s2 tachy rr no m GI: soft , bs wnl , no r Extremities: no edema   LABS: - updated 3pm 08/18/13  PULMONARY  Recent Labs Lab 08/18/13 0608 08/18/13 0814 08/18/13 1534 08/18/13 2247 08/19/13 1045  PHART 7.207* 7.224* 7.227* 7.299* 7.355  PCO2ART 54.6* 48.9* 54.5* 47.4* 42.8  PO2ART 66.0* 66.0* 85.0 76.0* 89.0  HCO3 21.5 20.2 22.4 23.2 23.9  TCO2 23 22 24 25 25   O2SAT 86.0 88.0 93.0 93.0 96.0    CBC  Recent Labs Lab 08/18/13 0100 08/19/13 0426 08/19/13 1000  HGB 12.7* 10.6* 10.6*  HCT 36.9* 30.6* 31.2*  WBC 13.2* 21.5* 22.2*  PLT 216 186 183    COAGULATION  Recent Labs Lab 08/17/13 1915  INR 1.09    CARDIAC    Recent Labs Lab 08/17/13 1835 08/18/13 1525 08/19/13 0426  TROPONINI <0.30 <0.30 <0.30    Recent Labs Lab 08/18/13 0100  PROBNP 571.3*     CHEMISTRY  Recent Labs Lab 08/17/13 1835 08/18/13 0100 08/18/13 1520 08/19/13 0426  NA 122* 129* 129* 132*  K 6.3* 3.8 4.3 3.9  CL 78* 92* 95* 97  CO2 31 26 22 22   GLUCOSE 226* 136* 214* 237*  BUN 24* 20 20 16   CREATININE 0.90 0.74 0.99 0.89  CALCIUM 9.1 7.2* 7.6*  7.8*  MG  --  1.3* 1.6  --   PHOS  --  2.7 3.4  --    Estimated Creatinine Clearance: 76.3 ml/min (by C-G formula based on Cr of 0.89).   LIVER  Recent Labs Lab 08/17/13 1835 08/17/13 1915 08/18/13 0100 08/19/13 0426  AST 30  --  16 12  ALT 33  --  17 14  ALKPHOS 73  --  42 148*  BILITOT 0.4  --  0.6 0.3  PROT 7.8  --  4.6* 4.9*  ALBUMIN 4.0  --  2.3* 1.9*  INR  --  1.09  --   --      INFECTIOUS  Recent Labs Lab 08/17/13 1855 08/18/13 0100 08/18/13 1517 08/19/13 0426  LATICACIDVEN 2.15 1.5  --   --   PROCALCITON  --   --  27.88 15.91     ENDOCRINE CBG (last 3)   Recent Labs  08/19/13 1045 08/19/13 1342 08/19/13 1704  GLUCAP 204* 169* 174*         IMAGING  x48h  Dg Abd 1 View  08/19/2013   CLINICAL DATA:  Feeding tube placement.  EXAM: ABDOMEN - 1 VIEW  COMPARISON:  Chest x-ray 08/19/2013  FINDINGS: Single spot image demonstrates a feeding tube tip in the proximal jejunum.  IMPRESSION: Feeding to placed into the proximal jejunum.   Electronically Signed   By: Kalman Jewels M.D.   On: 08/19/2013 14:03   Dg Chest Port 1 View  08/19/2013   CLINICAL DATA:  Check endotracheal tube position  EXAM: PORTABLE CHEST - 1 VIEW  COMPARISON:  08/18/2013  FINDINGS: Endotracheal tube is noted 4.8 cm above the carina. A nasogastric catheter is seen extending into the stomach. A left subclavian line is again noted and unchanged in the mid superior vena cava. The cardiac shadow is stable. Patchy infiltrates are again noted bilaterally. No acute abnormality is noted.  IMPRESSION: Tubes and lines as described above.  Stable bilateral infiltrates.   Electronically Signed   By: Inez Catalina M.D.   On: 08/19/2013 07:48   Dg Chest Port 1 View  08/18/2013   CLINICAL DATA:  ARDS  EXAM: PORTABLE CHEST - 1 VIEW  COMPARISON:  08/17/2013  FINDINGS: Cardiomediastinal silhouette is stable. Stable NG tube endotracheal tube position. Stable left subclavian central line. No pneumothorax. Again noted patchy bilateral airspace is right greater than left without change in aeration. Again for findings may be due to bilateral infiltrates, ARDS, or noncardiogenic edema.  IMPRESSION: Stable support apparatus. No pneumothorax. Again noted patchy bilateral airspace disease without change in aeration, right greater than left.   Electronically Signed   By: Lahoma Crocker M.D.   On: 08/18/2013 15:59   Dg Chest Port 1 View  08/17/2013   CLINICAL DATA:  Line placement  EXAM: PORTABLE CHEST - 1 VIEW  COMPARISON:  Chest x-ray from earlier the same day  FINDINGS: Interval placement of left subclavian central line. No complicating pneumothorax or visible hemothorax. The endotracheal tube remains in good  position, tip at the mid thoracic trachea level. Orogastric tube ends in the mid stomach.  Stable heart size and upper mediastinal contours.  Diffuse worsening of lung aeration, with fairly symmetric and perihilar predominant airspace disease.  IMPRESSION: 1. New left subclavian central line in good position. No pneumothorax. 2. Extensive bilateral lung opacity. Rapid worsening favors pulmonary edema (likely noncardiogenic), large volume aspiration, or pneumonia responding to hydration.   Electronically Signed   By: Roderic Palau  Watts M.D.   On: 08/17/2013 23:45   Dg Loyce Dys Tube Plc W/fl-no Rad  08/19/2013   CLINICAL DATA: post pyloric panda   NASO G TUBE PLACEMENT WITH FLUORO  Fluoroscopy was utilized by the requesting physician.  No radiographic  interpretation.       ASSESSMENT / PLAN:  PULMONARY A: ARDS Acute hypoxemic and hypercarbic respiratory failure. Respiratory Acidosis  Bilateral patchy infiltrates compatible with ARDS, likely aspiration pneumonia given dark colored tracheal aspirate. COPD  P:  -Keep RR low to avoid autoPEEP -goal ph 7.20, if ph less 7.25 with ards, then add bicarb - Scheduled Duonebs, no wheezing, not a home med, can worsen  ards- use only prn  - PRN albuterol nebs -Given improved PEEP requirements & lower FIO2, defer prone position  CARDIOVASCULAR A:  Septic shock  And autopeep  P:   -levophed decreasing -cvp 8, consider kvo -even to slight pos goals -maintain steroids -defer vaso  RENAL A:  At risk AKI Hyponatremia, likely volume depletion, poor oral intake. P:     - kvo cmet in am   GASTROINTESTINAL A:   N/V - prior to admit, concern for aspiration  P:   - Protonix for GI prophylaxis - ct oxepa - Nutrition consult  HEMATOLOGIC A:   DVT prevention, leukocytosis P:  - Will follow CBC, wbc with diff -sub q hep  INFECTIOUS A:   Aspiration pneumonia vs HCAP P:   -dc zosyn,ct  imipenem -cont vanc -Bronch bal if vent allows in  future  ENDOCRINE A:   DM P:   - Novolog sliding scale per ICU hyperglycemia protocol -NICE trial goals met  NEUROLOGIC A:   Acute Encephalopathy on ARDS protocol, severe ARDS  P:   - RASS goal: -4 to -5, paralysis still required - Versed drip - Fentanyl drip  GLOBAL Improving ARDS  Ccm time 28m Care during the described time interval was provided by me and/or other providers on the critical care team.  I have reviewed this patient's available data, including medical history, events of note, physical examination and test results as part of my evaluation  CC time x 366mRaKara MeadD. FCCP. [place] [facility] Pager [phone] no response call [phone]

## 2013-08-20 ENCOUNTER — Inpatient Hospital Stay (HOSPITAL_COMMUNITY): Payer: Medicare Other

## 2013-08-20 LAB — CULTURE, RESPIRATORY W GRAM STAIN

## 2013-08-20 LAB — COMPREHENSIVE METABOLIC PANEL
ALBUMIN: 1.6 g/dL — AB (ref 3.5–5.2)
ALT: 16 U/L (ref 0–53)
AST: 22 U/L (ref 0–37)
Alkaline Phosphatase: 46 U/L (ref 39–117)
BUN: 16 mg/dL (ref 6–23)
CALCIUM: 8.2 mg/dL — AB (ref 8.4–10.5)
CO2: 23 mEq/L (ref 19–32)
CREATININE: 0.71 mg/dL (ref 0.50–1.35)
Chloride: 101 mEq/L (ref 96–112)
GFR calc Af Amer: >90 mL/min (ref >90)
GFR calc non Af Amer: >90 mL/min (ref >90)
Glucose, Bld: 146 mg/dL — ABNORMAL HIGH (ref 70–99)
Potassium: 3.8 mEq/L (ref 3.7–5.3)
Sodium: 136 mEq/L — ABNORMAL LOW (ref 137–147)
Total Bilirubin: <0.2 mg/dL — ABNORMAL LOW (ref 0.3–1.2)
Total Protein: 4.8 g/dL — ABNORMAL LOW (ref 6.0–8.3)

## 2013-08-20 LAB — BODY FLUID CELL COUNT WITH DIFFERENTIAL
EOS FL: NONE SEEN %
Lymphs, Fluid: 6 %
Monocyte-Macrophage-Serous Fluid: 6 % — ABNORMAL LOW (ref 50–90)
Neutrophil Count, Fluid: 88 % — ABNORMAL HIGH (ref 0–25)
Total Nucleated Cell Count, Fluid: 5000 cu mm — ABNORMAL HIGH (ref 0–1000)

## 2013-08-20 LAB — GLUCOSE, CAPILLARY
GLUCOSE-CAPILLARY: 141 mg/dL — AB (ref 70–99)
GLUCOSE-CAPILLARY: 140 mg/dL — AB (ref 70–99)
GLUCOSE-CAPILLARY: 104 mg/dL — AB (ref 70–99)
GLUCOSE-CAPILLARY: 119 mg/dL — AB (ref 70–99)
Glucose-Capillary: 103 mg/dL — ABNORMAL HIGH (ref 70–99)
Glucose-Capillary: 150 mg/dL — ABNORMAL HIGH (ref 70–99)

## 2013-08-20 LAB — CULTURE, RESPIRATORY

## 2013-08-20 LAB — BLOOD GAS, ARTERIAL
Acid-Base Excess: 0.8 mmol/L (ref 0.0–2.0)
Bicarbonate: 24.9 mEq/L — ABNORMAL HIGH (ref 20.0–24.0)
DRAWN BY: 24531
FIO2: 50 %
LHR: 28 {breaths}/min
O2 Saturation: 99.1 %
PCO2 ART: 38.4 mmHg (ref 35.0–45.0)
PEEP: 10 cmH2O
Patient temperature: 97.7
TCO2: 26.1 mmol/L (ref 0–100)
VT: 440 mL
pH, Arterial: 7.424 (ref 7.350–7.450)
pO2, Arterial: 79.1 mmHg — ABNORMAL LOW (ref 80.0–100.0)

## 2013-08-20 LAB — VANCOMYCIN, TROUGH: Vancomycin Tr: 15.4 ug/mL (ref 10.0–20.0)

## 2013-08-20 LAB — PROCALCITONIN: Procalcitonin: 7.93 ng/mL

## 2013-08-20 MED ORDER — ETOMIDATE 2 MG/ML IV SOLN
20.0000 mg | Freq: Once | INTRAVENOUS | Status: AC
  Administered 2013-08-20: 20 mg via INTRAVENOUS

## 2013-08-20 MED ORDER — SODIUM CHLORIDE 0.9 % IV SOLN
1250.0000 mg | Freq: Two times a day (BID) | INTRAVENOUS | Status: DC
  Administered 2013-08-20: 1250 mg via INTRAVENOUS
  Filled 2013-08-20 (×2): qty 1250

## 2013-08-20 MED ORDER — ETOMIDATE 2 MG/ML IV SOLN
INTRAVENOUS | Status: AC
  Filled 2013-08-20: qty 10

## 2013-08-20 MED FILL — Fentanyl Citrate Inj 0.05 MG/ML: INTRAMUSCULAR | Qty: 2 | Status: AC

## 2013-08-20 NOTE — Progress Notes (Signed)
PULMONARY / CRITICAL CARE MEDICINE   Name: [name] [name] MRN: [id number] DOB: [birth year]    ADMISSION DATE:  08/17/2013  PRIMARY SERVICE: PCCM  CHIEF COMPLAINT:  Respiratory distress.   BRIEF PATIENT DESCRIPTION:   73 years old with DM, HTN and COPD. Presents to [facility] with acute hypoxemic respiratory failure. Being managed in ICU for Septic shock, ARDS.  SIGNIFICANT EVENTS / STUDIES:  6/27 - Admit w resp fx, cxr w bilateral patchy infiltrates.  Intubated & tx to [facility] 6/29- paralyzed, peep 12, pressors required 6/30 -improved peep, fio2 requirements, improved pressors, prone avoided  LINES / TUBES: ETT 6/27>>> Left subclavian CVC 6/27 >>> 6/28 rt rad a line>>>  CULTURES: Bood cultures 6/27 > Tracheal aspirate 6/27 > Urine culture 6/27 > Resp virus PCR 6/28 >> Urine leg 6/28 Urine strep 6/28  ANTIBIOTICS: - Recently completed levaquin for COPD exacerbation.  Zosyn 6/27>>>6/29 6/29 Imipenem>>> Vancomycin 6/27>>>  SUBJECTIVE: Pos 3.2 liters Levo down Peep at 8   VITAL SIGNS: Temp:  [97.2 F (36.2 C)-98.4 F (36.9 C)] 97.5 F (36.4 C) (06/30 0700) Pulse Rate:  [66-107] 74 (06/30 0744) Resp:  [27-35] 28 (06/30 0744) BP: (96-147)/(45-69) 96/47 mmHg (06/30 0744) SpO2:  [92 %-100 %] 98 % (06/30 0744) Arterial Line BP: (78-158)/(44-72) 83/44 mmHg (06/30 0600) FiO2 (%):  [50 %-70 %] 50 % (06/30 0744) Weight:  [82.4 kg (181 lb 10.5 oz)] 82.4 kg (181 lb 10.5 oz) (06/30 0600) HEMODYNAMICS: CVP:  [5 mmHg-13 mmHg] 10 mmHg VENTILATOR SETTINGS: Vent Mode:  [-] PRVC FiO2 (%):  [50 %-70 %] 50 % Set Rate:  [28 bmp-35 bmp] 28 bmp Vt Set:  [440 mL] 440 mL PEEP:  [8 cmH20-12 cmH20] 8 cmH20 Plateau Pressure:  [19 cmH20-28 cmH20] 19 cmH20 INTAKE / OUTPUT: Intake/Output     06/29 0701 - 06/30 0700 06/30 0701 - 07/01 0700   I.V. (mL/kg) 1916.8 (23.3)    NG/GT 600    IV Piggyback 700    Total Intake(mL/kg) 3216.8 (39)    Urine (mL/kg/hr) 1850 (0.9)     Emesis/NG output     Total Output 1850     Net +1366.8            PHYSICAL EXAMINATION: General: rass -5 Neuro: paralyzed,sedated deep, perr 2 HEENT: jvd increased PULM: coarse right greater left CV: s1 s2 rrr GI: soft , bs wnl , no r Extremities: increased edema   PULMONARY  Recent Labs Lab 08/18/13 0814 08/18/13 1534 08/18/13 2247 08/19/13 1045 08/20/13 0500  PHART 7.224* 7.227* 7.299* 7.355 7.424  PCO2ART 48.9* 54.5* 47.4* 42.8 38.4  PO2ART 66.0* 85.0 76.0* 89.0 79.1*  HCO3 20.2 22.4 23.2 23.9 24.9*  TCO2 _0 26.1  O2SAT 88.0 93.0 93.0 96.0 99.1    CBC  Recent Labs Lab 08/18/13 0100 08/19/13 0426 08/19/13 1000  HGB 12.7* 10.6* 10.6*  HCT 36.9* 30.6* 31.2*  WBC 13.2* 21.5* 22.2*  PLT 216 186 183    COAGULATION  Recent Labs Lab 08/17/13 1915  INR 1.09    CARDIAC    Recent Labs Lab 08/17/13 1835 08/18/13 1525 08/19/13 0426  TROPONINI <0.30 <0.30 <0.30    Recent Labs Lab 08/18/13 0100  PROBNP 571.3*     CHEMISTRY  Recent Labs Lab 08/17/13 1835 08/18/13 0100 08/18/13 1520 08/19/13 0426 08/20/13 0407  NA 122* 129* 129* 132* 136*  K 6.3* 3.8 4.3 3.9 3.8  CL 78* 92* 95* 97 101  CO2 _1 22  23  GLUCOSE 226* 136* 214* 237* 146*  BUN 24* _0 CREATININE 0.90 0.74 0.99 0.89 0.71  CALCIUM 9.1 7.2* 7.6* 7.8* 8.2*  MG  --  1.3* 1.6  --   --   PHOS  --  2.7 3.4  --   --    Estimated Creatinine Clearance: 84.9 ml/min (by C-G formula based on Cr of 0.71).   LIVER  Recent Labs Lab 08/17/13 1835 08/17/13 1915 08/18/13 0100 08/19/13 0426 08/20/13 0407  AST 30  --  _1 ALT 33  --  _2 ALKPHOS 73  --  42 148* 46  BILITOT 0.4  --  0.6 0.3 <0.2*  PROT 7.8  --  4.6* 4.9* 4.8*  ALBUMIN 4.0  --  2.3* 1.9* 1.6*  INR  --  1.09  --   --   --      INFECTIOUS  Recent Labs Lab 08/17/13 1855 08/18/13 0100 08/18/13 1517 08/19/13 0426 08/20/13 0407  LATICACIDVEN 2.15 1.5  --   --   --    PROCALCITON  --   --  27.88 15.91 7.93     ENDOCRINE CBG (last 3)   Recent Labs  08/19/13 1948 08/19/13 2329 08/20/13 0348  GLUCAP 123* 103* 141*         IMAGING x48h  Dg Abd 1 View  08/19/2013   CLINICAL DATA:  Feeding tube placement.  EXAM: ABDOMEN - 1 VIEW  COMPARISON:  Chest x-ray 08/19/2013  FINDINGS: Single spot image demonstrates a feeding tube tip in the proximal jejunum.  IMPRESSION: Feeding to placed into the proximal jejunum.   Electronically Signed   By: [name] [name] M.D.   On: 08/19/2013 14:03   Dg Chest Port 1 View  08/20/2013   CLINICAL DATA:  Endotracheal tube position  EXAM: PORTABLE CHEST - 1 VIEW  COMPARISON:  08/19/2013  FINDINGS: Endotracheal tube in good position. Left subclavian central venous catheter tip in the SVC. Interval placement of feeding tube in the stomach with the tip not visualized.  Diffuse bilateral airspace disease is stable. Small pleural effusions and bibasilar atelectasis are stable. No pneumothorax.  IMPRESSION: Endotracheal tube in good position. Diffuse bilateral airspace disease is stable.   Electronically Signed   By: [name] [name] M.D.   On: 08/20/2013 07:29   Dg Chest Port 1 View  08/19/2013   CLINICAL DATA:  Check endotracheal tube position  EXAM: PORTABLE CHEST - 1 VIEW  COMPARISON:  08/18/2013  FINDINGS: Endotracheal tube is noted 4.8 cm above the carina. A nasogastric catheter is seen extending into the stomach. A left subclavian line is again noted and unchanged in the mid superior vena cava. The cardiac shadow is stable. Patchy infiltrates are again noted bilaterally. No acute abnormality is noted.  IMPRESSION: Tubes and lines as described above.  Stable bilateral infiltrates.   Electronically Signed   By: [name] [name] M.D.   On: 08/19/2013 07:48   Dg Chest Port 1 View  08/18/2013   CLINICAL DATA:  ARDS  EXAM: PORTABLE CHEST - 1 VIEW  COMPARISON:  08/17/2013  FINDINGS: Cardiomediastinal silhouette is stable. Stable NG tube  endotracheal tube position. Stable left subclavian central line. No pneumothorax. Again noted patchy bilateral airspace is right greater than left without change in aeration. Again for findings may be due to bilateral infiltrates, ARDS, or noncardiogenic edema.  IMPRESSION: Stable support apparatus. No pneumothorax. Again noted patchy bilateral airspace disease without change in  aeration, right greater than left.   Electronically Signed   By: [name] [name] M.D.   On: 08/18/2013 15:59   Dg Addison Bailey G Tube Plc W/fl-no Rad  08/19/2013   CLINICAL DATA: post pyloric panda   NASO G TUBE PLACEMENT WITH FLUORO  Fluoroscopy was utilized by the requesting physician.  No radiographic  interpretation.     ASSESSMENT / PLAN:  PULMONARY A: ARDS Acute hypoxemic and hypercarbic respiratory failure. Respiratory Acidosis  Bilateral patchy infiltrates compatible with ARDS, likely aspiration pneumonia given dark colored tracheal aspirate. COPD Recent shingles, see ID  P:  -Improved to peep 8, goal to peep 5 if able -reduce volume, consider lasix in setting ards, if O2 needs worsen -reduce arte vent, abg reviewed -dc paralysis -pcxr in am  -abg in am -if able to dc paralysis and reduce sedation consider SBT  CARDIOVASCULAR A:  Septic shock  And autopeep No evidence rel AI P:   -levophed decreasing significantly -cvp10, kvo -even balance goals -dc roids  RENAL A:  At risk AKI Hyponatremia, likely volume depletion as responding to nacl P:    - kvo -bmet in am  -concentrate volume  GASTROINTESTINAL A:   N/V - prior to admit, concern for aspiration  P:   - Protonix for GI prophylaxis - ct oxepa, maintain  HEMATOLOGIC A:   DVT prevention, leukocytosis P:  -sub q hep  INFECTIOUS A:   Aspiration pneumonia vs HCAP At risk HSV tracheobronchitis P:   -PCT dropping and clinical status good - consider dc vanc -in am if remains culture neg will narrow off imipenem -low threshold bronch as  etiology of both hospital stays is unclear and remains culture neg -await viral panel  ENDOCRINE A:   DM P:   - Novolog sliding scale per ICU hyperglycemia protocol -NICE trial goals met  NEUROLOGIC A:   Acute Encephalopathy on ARDS protocol, severe ARDS  P:   - RASS goal: -2 - Versed drip to dc after paralysis off - Fentanyl drip -dc nimbex  GLOBAL Improving ARDS and shock state  Ccm time 16m Care during the described time interval was provided by me and/or other providers on the critical care team.  I have reviewed this patient's available data, including medical history, events of note, physical examination and test results as part of my evaluation  CC time x 371mDaniel J. FeTitus MouldMD, FAPark Hillsgr: 37[facility]

## 2013-08-20 NOTE — Progress Notes (Signed)
ANTIBIOTIC CONSULT NOTE - FOLLOW UP  Pharmacy Consult for Vancomycin Indication: pneumonia  Allergies  Allergen Reactions  . Lisinopril Other (See Comments)    Unknown reaction    Patient Measurements: Height: 5\' 10"  (177.8 cm) Weight: 172 lb 9.9 oz (78.3 kg) IBW/kg (Calculated) : 73  Vital Signs: Temp: 97.3 F (36.3 C) (06/29 2200) Temp src: Core (Comment) (06/29 2200) BP: 130/69 mmHg (06/29 2200) Pulse Rate: 96 (06/30 0003) Intake/Output from previous day: 06/29 0701 - 06/30 0700 In: 1902.7 [I.V.:1362.7; NG/GT:240; IV Piggyback:300] Out: 1125 [Urine:1125] Intake/Output from this shift: Total I/O In: 236.2 [I.V.:236.2] Out: 675 [Urine:675]  Labs:  Recent Labs  08/18/13 0100 08/18/13 1520 08/19/13 0426 08/19/13 1000  WBC 13.2*  --  21.5* 22.2*  HGB 12.7*  --  10.6* 10.6*  PLT 216  --  186 183  CREATININE 0.74 0.99 0.89  --    Estimated Creatinine Clearance: 76.3 ml/min (by C-G formula based on Cr of 0.89).  Recent Labs  08/19/13 2335  VANCOTROUGH 15.4     Microbiology: Recent Results (from the past 720 hour(s))  URINE CULTURE     Status: None   Collection Time    08/17/13  7:10 PM      Result Value Ref Range Status   Specimen Description URINE, CLEAN CATCH   Final   Special Requests NONE   Final   Culture  Setup Time     Final   Value: 08/18/2013 03:34     Performed at [facility]   Colony Count     Final   Value: NO GROWTH     Performed at [facility]   Culture     Final   Value: NO GROWTH     Performed at [facility]   Report Status 08/19/2013 FINAL   Final  CULTURE, BLOOD (ROUTINE X 2)     Status: None   Collection Time    08/17/13  7:15 PM      Result Value Ref Range Status   Specimen Description BLOOD RIGHT WRIST ARTERY   Final   Special Requests BOTTLES DRAWN AEROBIC AND ANAEROBIC 5CC BLUE TOP   Final   Culture  Setup Time     Final   Value: 08/18/2013 02:21     Performed at [facility]   Culture      Final   Value:        BLOOD CULTURE RECEIVED NO GROWTH TO DATE CULTURE WILL BE HELD FOR 5 DAYS BEFORE ISSUING A FINAL NEGATIVE REPORT     Performed at [facility]   Report Status PENDING   Incomplete  MRSA PCR SCREENING     Status: None   Collection Time    08/17/13  9:48 PM      Result Value Ref Range Status   MRSA by PCR NEGATIVE  NEGATIVE Final   Comment:            The GeneXpert MRSA Assay (FDA     approved for NASAL specimens     only), is one component of a     comprehensive MRSA colonization     surveillance program. It is not     intended to diagnose MRSA     infection nor to guide or     monitor treatment for     MRSA infections.  CULTURE, RESPIRATORY (NON-EXPECTORATED)     Status: None   Collection Time    08/17/13 11:07 PM  Result Value Ref Range Status   Specimen Description TRACHEAL ASPIRATE   Final   Special Requests NONE   Final   Gram Stain     Final   Value: MODERATE WBC PRESENT, PREDOMINANTLY PMN     FEW SQUAMOUS EPITHELIAL CELLS PRESENT     FEW GRAM POSITIVE COCCI IN PAIRS     Performed at [facility]   Culture     Final   Value: Culture reincubated for better growth     Performed at [facility]   Report Status PENDING   Incomplete    Anti-infectives   Start     Dose/Rate Route Frequency Ordered Stop   08/19/13 1200  imipenem-cilastatin (PRIMAXIN) 500 mg in sodium chloride 0.9 % 100 mL IVPB     500 mg 200 mL/hr over 30 Minutes Intravenous 4 times per day 08/19/13 0908     08/18/13 1200  vancomycin (VANCOCIN) IVPB 1000 mg/200 mL premix     1,000 mg 200 mL/hr over 60 Minutes Intravenous Every 12 hours 08/17/13 2327     08/18/13 0400  piperacillin-tazobactam (ZOSYN) IVPB 3.375 g  Status:  Discontinued     3.375 g 12.5 mL/hr over 240 Minutes Intravenous 3 times per day 08/17/13 2327 08/19/13 0907   08/17/13 1900  piperacillin-tazobactam (ZOSYN) IVPB 3.375 g     3.375 g 100 mL/hr over 30 Minutes Intravenous  Once  08/17/13 1857 08/17/13 2058   08/17/13 1900  vancomycin (VANCOCIN) IVPB 1000 mg/200 mL premix     1,000 mg 200 mL/hr over 60 Minutes Intravenous  Once 08/17/13 1857 08/18/13 0345      Assessment: 73 yo male with ARDS/PNA for empiric antibiotics.  Level tonight within goal range; however prior dose given ~ 2 hrs late so actual trough likely ~12-13.    Goal of Therapy:  Vancomycin trough level 15-20 mcg/ml  Plan:  Change vancomycin 1250 mg IV q12h with next dose  Abbott, Gary FleetGregory Vernon 08/20/2013,12:24 AM

## 2013-08-20 NOTE — Procedures (Signed)
Bronchoscopy Procedure Note Vaughan BastaJasper Hughston [id number] 1940/09/11  Procedure: Bronchoscopy Indications: Diagnostic evaluation of the airways, Obtain specimens for culture and/or other diagnostic studies and Remove secretions  Procedure Details Consent: Risks of procedure as well as the alternatives and risks of each were explained to the (patient/caregiver).  Consent for procedure obtained. Time Out: Verified patient identification, verified procedure, site/side was marked, verified correct patient position, special equipment/implants available, medications/allergies/relevent history reviewed, required imaging and test results available.  Performed  In preparation for procedure, patient was given 100% FiO2 and bronchoscope lubricated. Sedation: Etomidate  Airway entered and the following bronchi were examined: RUL, RML, RLL, LUL, LLL and Bronchi.   Procedures performed: Brushings performed - no Bronchoscope removed.  , Patient placed back on 100% FiO2 at conclusion of procedure.    Evaluation Hemodynamic Status: BP stable throughout; O2 sats: stable throughout Patient's Current Condition: stable Specimens:  Sent purulent fluid Complications: No apparent complications Patient did tolerate procedure well.  1. NO HSV lesions noted 2. Complete obstruction entire BI, thick yellow dark red pus - suctioned clear 3. BAL RML, pus yellow thick  Nelda BucksFEINSTEIN,Kenyette Gundy J. 08/20/2013

## 2013-08-21 ENCOUNTER — Inpatient Hospital Stay (HOSPITAL_COMMUNITY): Payer: Medicare Other

## 2013-08-21 LAB — RESPIRATORY VIRUS PANEL
Adenovirus: NOT DETECTED
INFLUENZA A H3: NOT DETECTED
Influenza A H1: NOT DETECTED
Influenza A: NOT DETECTED
Influenza B: NOT DETECTED
Metapneumovirus: NOT DETECTED
Parainfluenza 1: NOT DETECTED
Parainfluenza 2: NOT DETECTED
Parainfluenza 3: NOT DETECTED
RESPIRATORY SYNCYTIAL VIRUS B: NOT DETECTED
RHINOVIRUS: NOT DETECTED
Respiratory Syncytial Virus A: NOT DETECTED

## 2013-08-21 LAB — BASIC METABOLIC PANEL
BUN: 18 mg/dL (ref 6–23)
CO2: 27 mEq/L (ref 19–32)
CREATININE: 0.71 mg/dL (ref 0.50–1.35)
Calcium: 8.3 mg/dL — ABNORMAL LOW (ref 8.4–10.5)
Chloride: 101 mEq/L (ref 96–112)
GFR calc non Af Amer: >90 mL/min (ref >90)
Glucose, Bld: 176 mg/dL — ABNORMAL HIGH (ref 70–99)
Potassium: 3.7 mEq/L (ref 3.7–5.3)
Sodium: 138 mEq/L (ref 137–147)

## 2013-08-21 LAB — CBC WITH DIFFERENTIAL/PLATELET
Basophils Absolute: 0 10*3/uL (ref 0.0–0.1)
Basophils Relative: 0 % (ref 0–1)
Eosinophils Absolute: 0.1 10*3/uL (ref 0.0–0.7)
Eosinophils Relative: 1 % (ref 0–5)
HEMATOCRIT: 23.4 % — AB (ref 39.0–52.0)
Hemoglobin: 8.2 g/dL — ABNORMAL LOW (ref 13.0–17.0)
LYMPHS ABS: 0.8 10*3/uL (ref 0.7–4.0)
LYMPHS PCT: 6 % — AB (ref 12–46)
MCH: 32.9 pg (ref 26.0–34.0)
MCHC: 35 g/dL (ref 30.0–36.0)
MCV: 94 fL (ref 78.0–100.0)
Monocytes Absolute: 0.3 10*3/uL (ref 0.1–1.0)
Monocytes Relative: 2 % — ABNORMAL LOW (ref 3–12)
NEUTROS ABS: 12 10*3/uL — AB (ref 1.7–7.7)
Neutrophils Relative %: 91 % — ABNORMAL HIGH (ref 43–77)
PLATELETS: 166 10*3/uL (ref 150–400)
RBC: 2.49 MIL/uL — AB (ref 4.22–5.81)
RDW: 15.5 % (ref 11.5–15.5)
WBC: 13.2 10*3/uL — ABNORMAL HIGH (ref 4.0–10.5)

## 2013-08-21 LAB — GLUCOSE, CAPILLARY
GLUCOSE-CAPILLARY: 173 mg/dL — AB (ref 70–99)
GLUCOSE-CAPILLARY: 158 mg/dL — AB (ref 70–99)
Glucose-Capillary: 132 mg/dL — ABNORMAL HIGH (ref 70–99)
Glucose-Capillary: 155 mg/dL — ABNORMAL HIGH (ref 70–99)
Glucose-Capillary: 189 mg/dL — ABNORMAL HIGH (ref 70–99)
Glucose-Capillary: 143 mg/dL — ABNORMAL HIGH (ref 70–99)
Glucose-Capillary: 153 mg/dL — ABNORMAL HIGH (ref 70–99)

## 2013-08-21 LAB — BLOOD GAS, ARTERIAL
ACID-BASE EXCESS: 3.9 mmol/L — AB (ref 0.0–2.0)
Bicarbonate: 27.6 mEq/L — ABNORMAL HIGH (ref 20.0–24.0)
DRAWN BY: 308601
FIO2: 0.4 %
LHR: 28 {breaths}/min
O2 Saturation: 96.3 %
PEEP: 5 cmH2O
Patient temperature: 98.6
TCO2: 28.8 mmol/L (ref 0–100)
VT: 440 mL
pCO2 arterial: 39.2 mmHg (ref 35.0–45.0)
pH, Arterial: 7.461 — ABNORMAL HIGH (ref 7.350–7.450)
pO2, Arterial: 78.4 mmHg — ABNORMAL LOW (ref 80.0–100.0)

## 2013-08-21 LAB — PATHOLOGIST SMEAR REVIEW

## 2013-08-21 MED ORDER — BISACODYL 10 MG RE SUPP
10.0000 mg | Freq: Once | RECTAL | Status: AC
  Administered 2013-08-21: 10 mg via RECTAL
  Filled 2013-08-21: qty 1

## 2013-08-21 MED ORDER — DEXTROSE 5 % IV SOLN
1.0000 g | INTRAVENOUS | Status: AC
  Administered 2013-08-21 – 2013-08-27 (×7): 1 g via INTRAVENOUS
  Filled 2013-08-21 (×8): qty 10

## 2013-08-21 MED ORDER — POTASSIUM CHLORIDE 10 MEQ/50ML IV SOLN
INTRAVENOUS | Status: AC
  Filled 2013-08-21: qty 50

## 2013-08-21 MED ORDER — BISACODYL 10 MG RE SUPP: 10.0000 mg | Freq: Once | RECTAL | Status: AC | PRN

## 2013-08-21 MED ORDER — FUROSEMIDE 10 MG/ML IJ SOLN
20.0000 mg | Freq: Three times a day (TID) | INTRAMUSCULAR | Status: DC
  Administered 2013-08-21 – 2013-08-22 (×3): 20 mg via INTRAVENOUS
  Filled 2013-08-21 (×6): qty 2

## 2013-08-21 MED ORDER — POTASSIUM CHLORIDE 10 MEQ/100ML IV SOLN
10.0000 meq | INTRAVENOUS | Status: AC
  Administered 2013-08-21: 10 meq via INTRAVENOUS

## 2013-08-21 MED ORDER — POTASSIUM CHLORIDE 10 MEQ/50ML IV SOLN
INTRAVENOUS | Status: AC
  Administered 2013-08-21: 10 meq
  Filled 2013-08-21: qty 50

## 2013-08-21 MED ORDER — MIDAZOLAM HCL 2 MG/2ML IJ SOLN
INTRAMUSCULAR | Status: AC
  Filled 2013-08-21: qty 2

## 2013-08-21 NOTE — Progress Notes (Signed)
PULMONARY / CRITICAL CARE MEDICINE   Name: [name] [name] MRN: [id number] DOB: 1940/03/03    ADMISSION DATE:  08/17/2013  PRIMARY SERVICE: PCCM  CHIEF COMPLAINT:  Respiratory distress.   BRIEF PATIENT DESCRIPTION:   73 years old with DM, HTN and COPD. Pres[facility] with acute hypoxemic respiratory failure. Being manaded in ICU for Septic shock, ARDS.  SIGNIFICANT EVENTS / STUDIES:  6/27 - Admit w resp fx, cxr w bilateral patchy infiltrates.  [facility] 6[facility]- paralyzed, peep 12, pressors required 6/30 - s/p bronch - suctioned complete obstruction b/l, NO HSV 7/1 - CXR (unchanged diffuse b/l edema / infiltrate)  LINES / TUBES: ETT 6/27>>> Left subclavian CVC 6/27 >>> 6/28 rt rad a line>>>  CULTURES: Bood cultures 6/27 >>> NGTD Tracheal aspirate 6/27 > negative Urine culture 6/27 > No Growth Resp virus PCR 6/28 >> Urine leg 6/28 - Negative Urine strep 6/28 - Negative Bronch 6/30>>>  HSV>>>  ANTIBIOTICS: - Recently completed levaquin for COPD exacerbation.  Zosyn 6/27>>>6/29 6/29 Imipenem>>> 6/27 Vancomycin >>> 6/30  SUBJECTIVE: Pee 12 remains, 24 levophed, cvp 11  VITAL SIGNS: Temp:  [97.4 F (36.3 C)-100 F (37.8 C)] 99.7 F (37.6 C) (07/01 0700) Pulse Rate:  [72-101] 96 (07/01 0752) Resp:  [21-28] 21 (07/01 0752) BP: (96-131)/(37-57) 125/50 mmHg (07/01 0752) SpO2:  [92 %-100 %] 98 % (07/01 0752) Arterial Line BP: (77-132)/(38-66) 116/56 mmHg (07/01 0700) FiO2 (%):  [40 %-60 %] 40 % (07/01 0752) Weight:  [177 lb 0.5 oz (80.3 kg)] 177 lb 0.5 oz (80.3 kg) (07/01 0600) HEMODYNAMICS: CVP:  [5 mmHg-11 mmHg] 7 mmHg VENTILATOR SETTINGS: Vent Mode:  [-] PSV FiO2 (%):  [40 %-60 %] 40 % Set Rate:  [28 bmp] 28 bmp Vt Set:  [440 mL] 440 mL PEEP:  [5 cmH20-8 cmH20] 5 cmH20 Pressure Support:  [10 cmH20] 10 cmH20 Plateau Pressure:  [18 cmH20-25 cmH20] 18 cmH20 INTAKE / OUTPUT: Intake/Output     06/30 0701 - 07/01 0700 07/01 0701 -  07/02 0700   I.V. (mL/kg) 402.4 (5)    NG/GT 1063.5    IV Piggyback 650    Total Intake(mL/kg) 2115.9 (26.4)    Urine (mL/kg/hr) 2100 (1.1)    Total Output 2100     Net +15.9            PHYSICAL EXAMINATION: General: rass -5 Neuro: follows commands, rass -1, perr 2 HEENT: jvd noted increased PULM: coarse diffuse CV: s1 s2 tachy rr no m GI: soft , bs wnl , no r Extremities: no edema   LABS: - updated 3pm 08/18/13  PULMONARY  Recent Labs Lab 08/18/13 1534 08/18/13 2247 08/19/13 1045 08/20/13 0500 08/21/13 0340  PHART 7.227* 7.299* 7.355 7.424 7.461*  PCO2ART 54.5* 47.4* 42.8 38.4 39.2  PO2ART 85.0 76.0* 89.0 79.1* 78.4*  HCO3 22.4 23.2 23.9 24.9* 27.6*  TCO2 24 25 25  26.1 28.8  O2SAT 93.0 93.0 96.0 99.1 96.3    CBC  Recent Labs Lab 08/19/13 0426 08/19/13 1000 08/21/13 0530  HGB 10.6* 10.6* 8.2*  HCT 30.6* 31.2* 23.4*  WBC 21.5* 22.2* 13.2*  PLT 186 183 166    COAGULATION  Recent Labs Lab 08/17/13 1915  INR 1.09    CARDIAC    Recent Labs Lab 08/17/13 1835 08/18/13 1525 08/19/13 0426  TROPONINI <0.30 <0.30 <0.30    Recent Labs Lab 08/18/13 0100  PROBNP 571.3*     CHEMISTRY  Recent Labs Lab 08/17/13 1835 08/18/13 0100  08/18/13 1520 08/19/13 0426 08/20/13 0407 08/21/13 0530  NA 122* 129* 129* 132* 136* 138  K 6.3* 3.8 4.3 3.9 3.8 3.7  CL 78* 92* 95* 97 101 101  CO2 31 26 22 22 23 27   GLUCOSE 226* 136* 214* 237* 146* 176*  BUN 24* 20 20 16 16 18   CREATININE 0.90 0.74 0.99 0.89 0.71 0.71  CALCIUM 9.1 7.2* 7.6* 7.8* 8.2* 8.3*  MG  --  1.3* 1.6  --   --   --   PHOS  --  2.7 3.4  --   --   --    Estimated Creatinine Clearance: 84.9 ml/min (by C-G formula based on Cr of 0.71).   LIVER  Recent Labs Lab 08/17/13 1835 08/17/13 1915 08/18/13 0100 08/19/13 0426 08/20/13 0407  AST 30  --  16 12 22   ALT 33  --  17 14 16   ALKPHOS 73  --  42 148* 46  BILITOT 0.4  --  0.6 0.3 <0.2*  PROT 7.8  --  4.6* 4.9* 4.8*  ALBUMIN 4.0   --  2.3* 1.9* 1.6*  INR  --  1.09  --   --   --      INFECTIOUS  Recent Labs Lab 08/17/13 1855 08/18/13 0100 08/18/13 1517 08/19/13 0426 08/20/13 0407  LATICACIDVEN 2.15 1.5  --   --   --   PROCALCITON  --   --  27.88 15.91 7.93     ENDOCRINE CBG (last 3)   Recent Labs  08/20/13 2327 08/21/13 0333 08/21/13 0757  GLUCAP 132* 155* 173*         IMAGING x48h  Dg Abd 1 View  08/19/2013   CLINICAL DATA:  Feeding tube placement.  EXAM: ABDOMEN - 1 VIEW  COMPARISON:  Chest x-ray 08/19/2013  FINDINGS: Single spot image demonstrates a feeding tube tip in the proximal jejunum.  IMPRESSION: Feeding to placed into the proximal jejunum.   Electronically Signed   By: Loralie Champagne M.D.   On: 08/19/2013 14:03   Dg Chest Port 1 View  08/21/2013   CLINICAL DATA:  Check endotracheal tube.  Shortness of breath.  EXAM: PORTABLE CHEST - 1 VIEW  COMPARISON:  08/20/2013  FINDINGS: There is an endotracheal which ends in the mid thoracic trachea. A left subclavian central line is in stable position. A feeding tube coils in the stomach and terminates below the lower margin of this image.  No cardiomegaly. Stable upper mediastinal contours. Diffuse interstitial and airspace opacity appears similar. There is chronic elevation the left diaphragm. The right diaphragm is not well seen, possibly due to layering of fluid or differences in obliquity. No pneumothorax.  IMPRESSION: 1. Tubes and central line remain in good position. 2. Diffuse interstitial and airspace disease is similar to previous, likely a combination of edema and pneumonia.   Electronically Signed   By: Tiburcio Pea M.D.   On: 08/21/2013 05:53   Dg Chest Port 1 View  08/20/2013   CLINICAL DATA:  Endotracheal tube position  EXAM: PORTABLE CHEST - 1 VIEW  COMPARISON:  08/19/2013  FINDINGS: Endotracheal tube in good position. Left subclavian central venous catheter tip in the SVC. Interval placement of feeding tube in the stomach with  the tip not visualized.  Diffuse bilateral airspace disease is stable. Small pleural effusions and bibasilar atelectasis are stable. No pneumothorax.  IMPRESSION: Endotracheal tube in good position. Diffuse bilateral airspace disease is stable.   Electronically Signed   By: Leonette Most  Chestine Sporelark M.D.   On: 08/20/2013 07:29   Dg Vangie BickerNaso G Tube Plc W/fl-no Rad  08/19/2013   CLINICAL DATA: post pyloric panda   NASO G TUBE PLACEMENT WITH FLUORO  Fluoroscopy was utilized by the requesting physician.  No radiographic  interpretation.       ASSESSMENT / PLAN:  PULMONARY A: Acute hypoxemic and hypercarbic respiratory failure. Respiratory Acidosis - Improved (CO2 39) Bilateral patchy infiltrates compatible with ARDS, likely aspiration pneumonia given dark colored tracheal aspirate. ABG stable/improved nml pH 7.461 / pCO2 39.2 / pO2 78.4 / Bicarb 27.6 COPD P:   - avoid such gros pos balance, add lasix to neg baalnce - consider SBT when able, PS 10 needed - PRN levalbuterol nebs -pcxr concern edema, lasix, repeat in am  -ABg reviewed, reduce rate  CARDIOVASCULAR A:   Hypotension, concern sepsis - Improved cvp now 11 ECHO (LVEF 60-65%, Grade 1 Diastolic dysfunction)  P:   -levophed to map 60 - successful just off -cvp trend now to goal 4, FACTT -goal  Neg balance -maintain steroids in am reduce if remains off pressors  RENAL A:   Creatinine - Stable Hyponatremia - Resolved P:   -cvp 11, lasix IV tid addition -goal neg 2 liters -lytes in am, k supp today with addition lasix  GASTROINTESTINAL A:   N/V -monitor for aspiration P:   - Protonix for GI prophylaxis - gastric tube, begin TF 6/29 - to goal  - consider oxepa - Nutrition consult  HEMATOLOGIC A:   DVT prevention, leukocytosis Anemia - Hgb 10.6 >> 8.2 dilution P:  -lasix - SQ hep  INFECTIOUS A:   Aspiration pneumonia vs HCAP WBC improved 22.2 > 13.2 S/p Bronch 6/30 P:   -clinical progress, PCT rapidly drop - narrow to  ceftriaxone 8 days total -follow hsv  ENDOCRINE A:   DM P:   - ssi  NEUROLOGIC A:   Acute Encephalopathy on ARDS protocol, severe ARDS  P:   - RASS goal: 0 - Fentanyl drip to dc if able -WUA  GLOBAL Family updated wife, imrpoving daily  Ccm time 30 min   Saralyn PilarAlexander Karamalegos, DO [facility], PGY-1  Mcarthur Rossettianiel J. Tyson AliasFeinstein, MD, FACP Pgr: [phone] [place] [facility]

## 2013-08-21 NOTE — Progress Notes (Signed)
UR Completed.  [name] [name] Jane [phone] 08/21/2013  

## 2013-08-22 ENCOUNTER — Inpatient Hospital Stay (HOSPITAL_COMMUNITY): Payer: Medicare Other

## 2013-08-22 LAB — BASIC METABOLIC PANEL
ANION GAP: 9 (ref 5–15)
BUN: 24 mg/dL — ABNORMAL HIGH (ref 6–23)
CHLORIDE: 100 meq/L (ref 96–112)
CO2: 38 meq/L — AB (ref 19–32)
Calcium: 8.9 mg/dL (ref 8.4–10.5)
Creatinine, Ser: 0.65 mg/dL (ref 0.50–1.35)
GFR calc Af Amer: >90 mL/min (ref >90)
GFR calc non Af Amer: >90 mL/min (ref >90)
Glucose, Bld: 184 mg/dL — ABNORMAL HIGH (ref 70–99)
Potassium: 3.7 mEq/L (ref 3.7–5.3)
SODIUM: 147 meq/L (ref 137–147)

## 2013-08-22 LAB — CULTURE, BAL-QUANTITATIVE W GRAM STAIN
Colony Count: NO GROWTH
Culture: NO GROWTH

## 2013-08-22 LAB — GLUCOSE, CAPILLARY
GLUCOSE-CAPILLARY: 170 mg/dL — AB (ref 70–99)
GLUCOSE-CAPILLARY: 217 mg/dL — AB (ref 70–99)
Glucose-Capillary: 88 mg/dL (ref 70–99)
Glucose-Capillary: 171 mg/dL — ABNORMAL HIGH (ref 70–99)
Glucose-Capillary: 134 mg/dL — ABNORMAL HIGH (ref 70–99)

## 2013-08-22 LAB — CULTURE, BAL-QUANTITATIVE

## 2013-08-22 MED ORDER — INSULIN ASPART 100 UNIT/ML ~~LOC~~ SOLN
2.0000 [IU] | SUBCUTANEOUS | Status: DC
  Administered 2013-08-22: 2 [IU] via SUBCUTANEOUS
  Administered 2013-08-22: 6 [IU] via SUBCUTANEOUS
  Administered 2013-08-23 (×3): 4 [IU] via SUBCUTANEOUS
  Administered 2013-08-23: 6 [IU] via SUBCUTANEOUS
  Administered 2013-08-23 – 2013-08-24 (×3): 2 [IU] via SUBCUTANEOUS
  Administered 2013-08-24: 4 [IU] via SUBCUTANEOUS
  Administered 2013-08-24: 6 [IU] via SUBCUTANEOUS
  Administered 2013-08-24 – 2013-08-25 (×2): 4 [IU] via SUBCUTANEOUS
  Administered 2013-08-25: 6 [IU] via SUBCUTANEOUS
  Administered 2013-08-25 (×3): 4 [IU] via SUBCUTANEOUS
  Administered 2013-08-25 – 2013-08-26 (×4): 2 [IU] via SUBCUTANEOUS

## 2013-08-22 MED ORDER — OXEPA PO LIQD
1000.0000 mL | ORAL | Status: DC
  Administered 2013-08-23 – 2013-08-28 (×6): 1000 mL
  Filled 2013-08-22 (×7): qty 1000

## 2013-08-22 MED ORDER — FREE WATER
200.0000 mL | Freq: Three times a day (TID) | Status: DC
  Administered 2013-08-22 – 2013-08-25 (×9): 200 mL

## 2013-08-22 MED ORDER — RISPERIDONE 0.5 MG PO TABS
0.5000 mg | ORAL_TABLET | Freq: Two times a day (BID) | ORAL | Status: DC
  Administered 2013-08-22 – 2013-08-28 (×13): 0.5 mg via ORAL
  Filled 2013-08-22 (×14): qty 1

## 2013-08-22 MED ORDER — FUROSEMIDE 10 MG/ML IJ SOLN
10.0000 mg | Freq: Two times a day (BID) | INTRAMUSCULAR | Status: DC
  Administered 2013-08-22 – 2013-08-24 (×4): 10 mg via INTRAVENOUS
  Filled 2013-08-22 (×4): qty 1

## 2013-08-22 MED ORDER — AMLODIPINE BESYLATE 5 MG PO TABS
5.0000 mg | ORAL_TABLET | Freq: Every day | ORAL | Status: DC
  Administered 2013-08-22 – 2013-08-28 (×7): 5 mg
  Filled 2013-08-22 (×7): qty 1

## 2013-08-22 MED ORDER — AMLODIPINE 1 MG/ML ORAL SUSPENSION
5.0000 mg | Freq: Every day | ORAL | Status: DC
  Filled 2013-08-22: qty 5

## 2013-08-22 NOTE — Progress Notes (Signed)
PULMONARY / CRITICAL CARE MEDICINE   Name: [name] [name] MRN: [id number] DOB: [birth year]    ADMISSION DATE:  08/17/2013  PRIMARY SERVICE: PCCM  CHIEF COMPLAINT:  Respiratory distress.   BRIEF PATIENT DESCRIPTION:   73 years old with DM, HTN and COPD. Presents to [facility] with acute hypoxemic respiratory failure. Being manaded in ICU for Septic shock, ARDS.  SIGNIFICANT EVENTS / STUDIES:  6/27 - Admit w resp fx, cxr w bilateral patchy infiltrates.  Intub[facility] & [facility] 6/29- paralyzed, peep 12, pressors required 6/30 - s/p bronch - suctioned complete obstruction b/l, NO HSV 7/1 - CXR (unchanged diffuse b/l edema / infiltrate) 7/2- neg 4.6 liters  LINES / TUBES: ETT 6/27>>> Left subclavian CVC 6/27 >>> 6/28 rt rad a line>>>7/2  CULTURES: Bood cultures 6/27 >>> NGTD Tracheal aspirate 6/27 > negative Urine culture 6/27 > No Growth Resp virus PCR 6/28 >> Negative Urine leg 6/28 - Negative Urine strep 6/28 - Negative Bronch 6/30>>> oropharyngeal flora  HSV>>>  ANTIBIOTICS: - Recently completed levaquin for COPD exacerbation.  Zosyn 6/27>>>6/29 6/29 Imipenem>>> 6/27 Vancomycin >>> 6/30  SUBJECTIVE: Neg balance, HTN  VITAL SIGNS: Temp:  [99 F (37.2 C)-100 F (37.8 C)] 100 F (37.8 C) (07/02 0600) Pulse Rate:  [82-112] 85 (07/02 0600) Resp:  [12-29] 13 (07/02 0600) BP: (109-165)/(43-74) 128/47 mmHg (07/02 0600) SpO2:  [90 %-100 %] 97 % (07/02 0600) Arterial Line BP: (89-184)/(41-83) 126/51 mmHg (07/02 0600) FiO2 (%):  [40 %] 40 % (07/02 0600) Weight:  [165 lb 5.5 oz (75 kg)] 165 lb 5.5 oz (75 kg) (07/02 0428) HEMODYNAMICS: CVP:  [3 mmHg-20 mmHg] 5 mmHg VENTILATOR SETTINGS: Vent Mode:  [-] PRVC FiO2 (%):  [40 %] 40 % Set Rate:  [12 bmp] 12 bmp Vt Set:  [440 mL] 440 mL PEEP:  [5 cmH20] 5 cmH20 Plateau Pressure:  [18 cmH20-22 cmH20] 18 cmH20 INTAKE / OUTPUT: Intake/Output     07/01 0701 - 07/02 0700 07/02 0701 - 07/03 0700   I.V. (mL/kg)  298.2 (4)    NG/GT 675    IV Piggyback 50    Total Intake(mL/kg) 1023.2 (13.6)    Urine (mL/kg/hr) 7475 (4.2)    Total Output 7475     Net -6451.8          Stool Occurrence 1 x      PHYSICAL EXAMINATION: General: elderly man, somewhat confused Neuro: follows commands intermittently, rass -1, perrl, cam pos HEENT: jvd noted down PULM: coarse diffuse CV: s1 s2  rr no m GI: soft , bs wnl , no r Extremities: no edema   LABS:   PULMONARY  Recent Labs Lab 08/18/13 1534 08/18/13 2247 08/19/13 1045 08/20/13 0500 08/21/13 0340  PHART 7.227* 7.299* 7.355 7.424 7.461*  PCO2ART 54.5* 47.4* 42.8 38.4 39.2  PO2ART 85.0 76.0* 89.0 79.1* 78.4*  HCO3 22.4 23.2 23.9 24.9* 27.6*  TCO2 24 25 25  26.1 28.8  O2SAT 93.0 93.0 96.0 99.1 96.3    CBC  Recent Labs Lab 08/19/13 0426 08/19/13 1000 08/21/13 0530  HGB 10.6* 10.6* 8.2*  HCT 30.6* 31.2* 23.4*  WBC 21.5* 22.2* 13.2*  PLT 186 183 166    COAGULATION  Recent Labs Lab 08/17/13 1915  INR 1.09    CARDIAC    Recent Labs Lab 08/17/13 1835 08/18/13 1525 08/19/13 0426  TROPONINI <0.30 <0.30 <0.30    Recent Labs Lab 08/18/13 0100  PROBNP 571.3*     CHEMISTRY  Recent Labs Lab 08/17/13 1835 08/18/13  0100 08/18/13 1520 08/19/13 0426 08/20/13 0407 08/21/13 0530  NA 122* 129* 129* 132* 136* 138  K 6.3* 3.8 4.3 3.9 3.8 3.7  CL 78* 92* 95* 97 101 101  CO2 31 26 22 22 23 27   GLUCOSE 226* 136* 214* 237* 146* 176*  BUN 24* 20 20 16 16 18   CREATININE 0.90 0.74 0.99 0.89 0.71 0.71  CALCIUM 9.1 7.2* 7.6* 7.8* 8.2* 8.3*  MG  --  1.3* 1.6  --   --   --   PHOS  --  2.7 3.4  --   --   --    Estimated Creatinine Clearance: 84.9 ml/min (by C-G formula based on Cr of 0.71).   LIVER  Recent Labs Lab 08/17/13 1835 08/17/13 1915 08/18/13 0100 08/19/13 0426 08/20/13 0407  AST 30  --  16 12 22   ALT 33  --  17 14 16   ALKPHOS 73  --  42 148* 46  BILITOT 0.4  --  0.6 0.3 <0.2*  PROT 7.8  --  4.6* 4.9* 4.8*   ALBUMIN 4.0  --  2.3* 1.9* 1.6*  INR  --  1.09  --   --   --      INFECTIOUS  Recent Labs Lab 08/17/13 1855 08/18/13 0100 08/18/13 1517 08/19/13 0426 08/20/13 0407  LATICACIDVEN 2.15 1.5  --   --   --   PROCALCITON  --   --  27.88 15.91 7.93     ENDOCRINE CBG (last 3)   Recent Labs  08/21/13 1854 08/21/13 2318 08/22/13 0416  GLUCAP 143* 153* 88    IMAGING x48h  Dg Chest Port 1 View  08/21/2013   CLINICAL DATA:  Check endotracheal tube.  Shortness of breath.  EXAM: PORTABLE CHEST - 1 VIEW  COMPARISON:  08/20/2013  FINDINGS: There is an endotracheal which ends in the mid thoracic trachea. A left subclavian central line is in stable position. A feeding tube coils in the stomach and terminates below the lower margin of this image.  No cardiomegaly. Stable upper mediastinal contours. Diffuse interstitial and airspace opacity appears similar. There is chronic elevation the left diaphragm. The right diaphragm is not well seen, possibly due to layering of fluid or differences in obliquity. No pneumothorax.  IMPRESSION: 1. Tubes and central line remain in good position. 2. Diffuse interstitial and airspace disease is similar to previous, likely a combination of edema and pneumonia.   Electronically Signed   By: Tiburcio Pea M.D.   On: 08/21/2013 05:53    ASSESSMENT / PLAN:  PULMONARY A: Acute hypoxemic and hypercarbic respiratory failure. Respiratory Acidosis - Improved (CO2 39) Bilateral patchy infiltrates compatible with ARDS. ABG stable/improved nml pH 7.461 / pCO2 39.2 / pO2 78.4 / Bicarb 27.6 COPD - 6L yesterday P:   - continue lasix for neg balance, reduced dose - SBT attempt PS 5 / cpap 5, PS elevation needed, hope to reduce further if able - PRN levalbuterol nebs -pcxr concern edema, lasix, repeat lasix but lower  -upright position -pcxr in am   CARDIOVASCULAR A:   Hypotension, septic resolved cvp now 11 ECHO (LVEF 60-65%, Grade 1 Diastolic  dysfunction)  P:  -cvp trend now to goal 4, FACTT -goal  Neg balance -as HTn now, dc steroids all together - done  -add home norvas  RENAL A:   Creatinine - Stable Hyponatremia - Resolved P:   -cvp 11, lasix IV decrease to bid 10 mg -goal neg 2 liters -add free water  GASTROINTESTINAL A:   N/V -monitor for aspiration BM 7/1 Yeah!! P:   - Protonix for GI prophylaxis - gastric tube, begin TF 6/29 - to goal oxepa  HEMATOLOGIC A:   DVT prevention, leukocytosis Anemia - Hgb 10.6 >> 8.2 dilution P:  - lasix redcution - SQ hep  INFECTIOUS A:   Aspiration pneumonia vs HCAP WBC improved 22.2 > 13.2 S/p Bronch 6/30 P:   - ceftriaxone stop date in place - follow hsv  ENDOCRINE A:   DM P:   - ssi  NEUROLOGIC A:   Acute Encephalopathy Cam pos P:   - RASS goal: 0 - Fentanyl drip - WUA -add Risperdal     Beverely LowElena Adamo, MD, MPH [facility] PGY-2 08/22/2013 7:30 AM  Ccm time 30 min   .I have fully examined this patient and agree with above findings.     Mcarthur Rossettianiel J. Tyson AliasFeinstein, MD, FACP Pgr: [phone] [facility]

## 2013-08-22 NOTE — Progress Notes (Signed)
NUTRITION FOLLOW UP  Intervention:    Continue PEPuP Protocol; to better meet re-estimated needs, decrease Oxepa goal rate to 40 ml/h (960 ml per day), continue Prostat 30 ml QID to provide 1840 kcals, 120 gm protein, 754 ml free water daily.  Nutrition Dx:   Inadequate oral intake related to inability to eat as evidenced by NPO status, ongoing.  Goal:   Intake to meet >90% of estimated nutrition needs. Met.  Monitor:   TF tolerance/adequacy, weight trend, labs, vent status.  Assessment:   72 year old male with DM, HTN and COPD. Presented to [facility] on 6/27 with acute hypoxemic respiratory failure. Being managed in ICU for septic shock, ARDS.  Patient is currently intubated on ventilator support MV: 7.4 L/min Temp (24hrs), Avg:99.5 F (37.5 C), Min:99 F (37.2 C), Max:100 F (37.8 C)  Current TF regimen: Oxepa at 45 ml/h (1080 ml per day) and Prostat 30 ml QID to provide 2020 kcals, 128 gm protein, 848 ml free water daily.  Patient tolerating TF well with no residuals.  Height: Ht Readings from Last 1 Encounters:  08/17/13 5' 10" (1.778 m)    Weight Status:   Wt Readings from Last 1 Encounters:  08/22/13 165 lb 5.5 oz (75 kg)  08/19/13  172 lb 9.9 oz (78.3 kg)   Re-estimated needs:  Kcal: 1776 Protein: 110-125 gm  Fluid: 2 L  Skin: 2 stage 2 pressure ulcers to back  Diet Order: NPO   Intake/Output Summary (Last 24 hours) at 08/22/13 1212 Last data filed at 08/22/13 1200  Gross per 24 hour  Intake 1268.19 ml  Output   8175 ml  Net -6906.81 ml    Last BM: 7/2   Labs:   Recent Labs Lab 08/17/13 1835 08/18/13 0100 08/18/13 1520  08/20/13 0407 08/21/13 0530 08/22/13 0740  NA 122* 129* 129*  < > 136* 138 147  K 6.3* 3.8 4.3  < > 3.8 3.7 3.7  CL 78* 92* 95*  < > 101 101 100  CO2 31 26 22  < > 23 27 38*  BUN 24* 20 20  < > 16 18 24*  CREATININE 0.90 0.74 0.99  < > 0.71 0.71 0.65  CALCIUM 9.1 7.2* 7.6*  < > 8.2* 8.3* 8.9  MG  --   1.3* 1.6  --   --   --   --   PHOS  --  2.7 3.4  --   --   --   --   GLUCOSE 226* 136* 214*  < > 146* 176* 184*  < > = values in this interval not displayed.  CBG (last 3)   Recent Labs  08/21/13 2318 08/22/13 0416 08/22/13 0755  GLUCAP 153* 88 170*    Scheduled Meds: . amLODipine  5 mg Per Tube Daily  . antiseptic oral rinse  15 mL Mouth Rinse QID  . cefTRIAXone (ROCEPHIN)  IV  1 g Intravenous Q24H  . chlorhexidine  15 mL Mouth Rinse BID  . feeding supplement (OXEPA)  1,000 mL Per Tube Q24H  . feeding supplement (PRO-STAT SUGAR FREE 64)  30 mL Per Tube QID  . free water  200 mL Per Tube 3 times per day  . furosemide  10 mg Intravenous BID  . heparin  5,000 Units Subcutaneous 3 times per day  . insulin aspart  2-6 Units Subcutaneous 6 times per day  . pantoprazole (PROTONIX) IV  40 mg Intravenous QHS  . risperiDONE    0.5 mg Oral BID    Continuous Infusions: . sodium chloride 10 mL/hr at 08/22/13 0621  . fentaNYL infusion INTRAVENOUS 100 mcg/hr (08/22/13 1200)  . midazolam (VERSED) infusion Stopped (08/22/13 0600)  . norepinephrine (LEVOPHED) Adult infusion Stopped (08/21/13 0800)    Kimberly Harris, RD, LDN, CNSC Pager [phone] After Hours Pager [phone]     

## 2013-08-23 ENCOUNTER — Inpatient Hospital Stay (HOSPITAL_COMMUNITY): Payer: Medicare Other

## 2013-08-23 ENCOUNTER — Encounter (HOSPITAL_COMMUNITY): Payer: Self-pay | Admitting: *Deleted

## 2013-08-23 DIAGNOSIS — J189 Pneumonia, unspecified organism: Secondary | ICD-10-CM | POA: Diagnosis present

## 2013-08-23 LAB — GLUCOSE, CAPILLARY
GLUCOSE-CAPILLARY: 154 mg/dL — AB (ref 70–99)
GLUCOSE-CAPILLARY: 161 mg/dL — AB (ref 70–99)
Glucose-Capillary: 139 mg/dL — ABNORMAL HIGH (ref 70–99)
Glucose-Capillary: 160 mg/dL — ABNORMAL HIGH (ref 70–99)
Glucose-Capillary: 165 mg/dL — ABNORMAL HIGH (ref 70–99)
Glucose-Capillary: 231 mg/dL — ABNORMAL HIGH (ref 70–99)
Glucose-Capillary: 36 mg/dL — CL (ref 70–99)

## 2013-08-23 LAB — BASIC METABOLIC PANEL
ANION GAP: 6 (ref 5–15)
BUN: 27 mg/dL — AB (ref 6–23)
CHLORIDE: 100 meq/L (ref 96–112)
CO2: 44 meq/L — AB (ref 19–32)
CREATININE: 0.7 mg/dL (ref 0.50–1.35)
Calcium: 9 mg/dL (ref 8.4–10.5)
GFR calc Af Amer: >90 mL/min (ref >90)
GFR calc non Af Amer: >90 mL/min (ref >90)
Glucose, Bld: 34 mg/dL — CL (ref 70–99)
Potassium: 3.6 mEq/L — ABNORMAL LOW (ref 3.7–5.3)
Sodium: 150 mEq/L — ABNORMAL HIGH (ref 137–147)

## 2013-08-23 LAB — VIRAL CULTURE VIRC: CULTURE: NOT DETECTED

## 2013-08-23 LAB — CBC
HCT: 29 % — ABNORMAL LOW (ref 39.0–52.0)
Hemoglobin: 9.1 g/dL — ABNORMAL LOW (ref 13.0–17.0)
MCH: 32.4 pg (ref 26.0–34.0)
MCHC: 31.4 g/dL (ref 30.0–36.0)
MCV: 103.2 fL — AB (ref 78.0–100.0)
Platelets: 171 10*3/uL (ref 150–400)
RBC: 2.81 MIL/uL — AB (ref 4.22–5.81)
RDW: 16.8 % — ABNORMAL HIGH (ref 11.5–15.5)
WBC: 9.1 10*3/uL (ref 4.0–10.5)

## 2013-08-23 MED ORDER — DEXTROSE 50 % IV SOLN
INTRAVENOUS | Status: AC
  Filled 2013-08-23: qty 50

## 2013-08-23 MED ORDER — DEXTROSE 50 % IV SOLN
50.0000 mL | Freq: Once | INTRAVENOUS | Status: AC | PRN
  Administered 2013-08-23: 50 mL via INTRAVENOUS

## 2013-08-23 MED ORDER — HALOPERIDOL LACTATE 5 MG/ML IJ SOLN
1.0000 mg | Freq: Four times a day (QID) | INTRAMUSCULAR | Status: DC | PRN
  Administered 2013-08-23: 1 mg via INTRAVENOUS
  Filled 2013-08-23: qty 1

## 2013-08-23 NOTE — Progress Notes (Signed)
eLink Physician-Brief Progress Note Patient Name: Vaughan BastaJasper Stegner DOB: 1940-07-14 MRN: [id number]  Date of Service  08/23/2013   HPI/Events of Note   Pt with delirium   eICU Interventions  Prn haldol ordered   Intervention Category Major Interventions: Delirium, psychosis, severe agitation - evaluation and management  Shan Levansatrick Sherl Yzaguirre 08/23/2013, 3:47 PM

## 2013-08-23 NOTE — Progress Notes (Signed)
PULMONARY / CRITICAL CARE MEDICINE   Name: [name] [name] MRN: [id number] DOB: 1940-05-23    ADMISSION DATE:  08/17/2013  PRIMARY SERVICE: PCCM  CHIEF COMPLAINT:  Respiratory distress.   BRIEF PATIENT DESCRIPTION:  73 y/o with DM, HTN and COPD. [facility] 6/27 with acute hypoxemic respiratory failure. Found to have Septic shock, ARDS.  SIGNIFICANT EVENTS / STUDIES:  6/27 - Admit w resp fx, cxr w bilateral patchy infiltrates.  [facility] 6[facility]- paralyzed, peep 12, pressors required 6/30 - s/p bronch - suctioned complete obstruction b/l, NO HSV 7/01 - CXR (unchanged diffuse b/l edema / infiltrate) 7/02 - neg 4.6 liters 7/03 - hypoglycemia event (cbg 34), alert, on fentanyl only  LINES / TUBES: ETT 6/27>>> Left subclavian CVC 6/27 >>> 6/28 R rad a line>>>7/2  CULTURES: Bood cultures 6/27 >>> NGTD Tracheal aspirate 6/27 >>> negative Urine culture 6/27 >>> Neg Resp virus PCR 6/28 >>> Negative Urine leg 6/28 >>> Negative Urine strep 6/28 >>> Negative Bronch 6/30 >>> oropharyngeal flora  HSV >>>  ANTIBIOTICS: - Completed levaquin for COPD exacerbation PTA.  Zosyn 6/27>>>6/29 Imipenem 6/29>>> Vancomycin 6/27>>> 6/30 Rocephin 7/1 >>>  SUBJECTIVE:  RN reports pt had hypoglycemic event to 34, resolved with dextrose.  No further issues.  Off versed.    VITAL SIGNS: Temp:  [99.4 F (37.4 C)-100.2 F (37.9 C)] 100 F (37.8 C) (07/02 1800) Pulse Rate:  [57-111] 84 (07/03 0307) Resp:  [11-27] 13 (07/03 0307) BP: (114-171)/(43-97) 120/51 mmHg (07/03 0307) SpO2:  [95 %-100 %] 98 % (07/03 0307) Arterial Line BP: (167)/(81) 167/81 mmHg (07/02 0800) FiO2 (%):  [40 %] 40 % (07/03 0307) Weight:  [160 lb 7.9 oz (72.8 kg)] 160 lb 7.9 oz (72.8 kg) (07/03 0418) HEMODYNAMICS: CVP:  [5 mmHg-11 mmHg] 10 mmHg VENTILATOR SETTINGS: Vent Mode:  [-] PRVC FiO2 (%):  [40 %] 40 % Set Rate:  [12 bmp] 12 bmp Vt Set:  [440 mL] 440 mL PEEP:  [5 cmH20] 5  cmH20 Pressure Support:  [10 cmH20] 10 cmH20 Plateau Pressure:  [16 cmH20-22 cmH20] 22 cmH20 INTAKE / OUTPUT: Intake/Output     07/02 0701 - 07/03 0700 07/03 0701 - 07/04 0700   I.V. (mL/kg) 380 (5.2)    NG/GT 830    IV Piggyback 100    Total Intake(mL/kg) 1310 (18)    Urine (mL/kg/hr) 2450 (1.4)    Total Output 2450     Net -1140            PHYSICAL EXAMINATION: General: elderly man, in NAD Neuro: follows commands intermittently, rass -1, arcus senilis  HEENT: OETT, jvd, arcus senilis  PULM: resp's even/non-labored, lungs bilaterally coarse rhonchi  CV: s1s2  rrr no m GI: soft , bs wnl , non-tender on exam  Extremities: no edema   LABS:   PULMONARY  Recent Labs Lab 08/18/13 1534 08/18/13 2247 08/19/13 1045 08/20/13 0500 08/21/13 0340  PHART 7.227* 7.299* 7.355 7.424 7.461*  PCO2ART 54.5* 47.4* 42.8 38.4 39.2  PO2ART 85.0 76.0* 89.0 79.1* 78.4*  HCO3 22.4 23.2 23.9 24.9* 27.6*  TCO2 24 25 25  26.1 28.8  O2SAT 93.0 93.0 96.0 99.1 96.3   CBC  Recent Labs Lab 08/19/13 1000 08/21/13 0530 08/23/13 0410  HGB 10.6* 8.2* 9.1*  HCT 31.2* 23.4* 29.0*  WBC 22.2* 13.2* 9.1  PLT 183 166 171   COAGULATION  Recent Labs Lab 08/17/13 1915  INR 1.09   CARDIAC  Recent Labs Lab 08/17/13 1835  08/18/13 1525 08/19/13 0426  TROPONINI <0.30 <0.30 <0.30    Recent Labs Lab 08/18/13 0100  PROBNP 571.3*   CHEMISTRY  Recent Labs Lab 08/17/13 1835 08/18/13 0100 08/18/13 1520 08/19/13 0426 08/20/13 0407 08/21/13 0530 08/22/13 0740 08/23/13 0410  NA 122* 129* 129* 132* 136* 138 147 150*  K 6.3* 3.8 4.3 3.9 3.8 3.7 3.7 3.6*  CL 78* 92* 95* 97 101 101 100 100  CO2 31 26 22 22 23 27  38* 44*  GLUCOSE 226* 136* 214* 237* 146* 176* 184* 34*  BUN 24* 20 20 16 16 18  24* 27*  CREATININE 0.90 0.74 0.99 0.89 0.71 0.71 0.65 0.70  CALCIUM 9.1 7.2* 7.6* 7.8* 8.2* 8.3* 8.9 9.0  MG  --  1.3* 1.6  --   --   --   --   --   PHOS  --  2.7 3.4  --   --   --   --   --     Estimated Creatinine Clearance: 84.7 ml/min (by C-G formula based on Cr of 0.7).  LIVER  Recent Labs Lab 08/17/13 1835 08/17/13 1915 08/18/13 0100 08/19/13 0426 08/20/13 0407  AST 30  --  16 12 22   ALT 33  --  17 14 16   ALKPHOS 73  --  42 148* 46  BILITOT 0.4  --  0.6 0.3 <0.2*  PROT 7.8  --  4.6* 4.9* 4.8*  ALBUMIN 4.0  --  2.3* 1.9* 1.6*  INR  --  1.09  --   --   --    INFECTIOUS  Recent Labs Lab 08/17/13 1855 08/18/13 0100 08/18/13 1517 08/19/13 0426 08/20/13 0407  LATICACIDVEN 2.15 1.5  --   --   --   PROCALCITON  --   --  27.88 15.91 7.93   ENDOCRINE CBG (last 3)   Recent Labs  08/22/13 2349 08/23/13 0430 08/23/13 0449  GLUCAP 160* 36* 154*   IMAGING x48h  Dg Chest Port 1 View  08/22/2013   CLINICAL DATA:  Pulmonary edema, history hypertension, diabetes, COPD  EXAM: PORTABLE CHEST - 1 VIEW  COMPARISON:  Portable exam 1233 hr compared to 08/21/2013  FINDINGS: Tip of endotracheal tube projects approximately 6.4 cm above carina.  Feeding tube extends into stomach.  LEFT subclavian central venous catheter tip projects over SVC.  Slight rotation to the RIGHT.  Stable heart size.  Scattered BILATERAL pulmonary infiltrates could represent edema and/or infection.  No gross pleural effusion or pneumothorax.  IMPRESSION: Persistent pulmonary infiltrates.   Electronically Signed   By: Ulyses SouthwardMark  Boles M.D.   On: 08/22/2013 12:47    ASSESSMENT / PLAN:  PULMONARY A: Acute hypoxemic and hypercarbic respiratory failure. Respiratory Acidosis - Improved (CO2 39) Bilateral patchy infiltrates compatible with ARDS - ABG stable/improved nml pH 7.461 / pCO2 39.2 / pO2 78.4 / Bicarb 27.6   COPD.   P:   -continue lasix at reduced dose -Daily SBT  -PRN levalbuterol nebs -upright position -pcxr in am   CARDIOVASCULAR A:   Hypotension - secondary to sepsis  Diastolic Dysfunction - ECHO (LVEF 60-65%, Grade 1 Diastolic dysfunction) P:  -Trend CVP -goal neg balance -continue  norvasc  RENAL A:   Creatinine - Stable Hyponatremia - Resolved P:   -continue lasix 10 mg BID -Free water 200 ml Q8  GASTROINTESTINAL A:   N/V - monitor for aspiration Constipation - last bm 7/1 P:   - Protonix for GI prophylaxis - TF initiated 6/29 - oxepa  HEMATOLOGIC  A:   Leukocytosis Anemia  P:  - SQ hep  INFECTIOUS A:   Aspiration pneumonia vs HCAP  - WBC improved 22.2 > 13.2 S/p Bronch 6/30 P:   -ceftriaxone stop date in place -HSV pending >>  ENDOCRINE A:   DM Hypoglycemia P:   -SSI  NEUROLOGIC A:   Acute Encephalopathy Delirium  P:   -RASS goal: 0 -Fentanyl drip -WUA -Risperdal   GLOBAL: -no family available this am -Full code   Canary Brim, NP-C [facility] Pgr: [phone] or [phone]    08/23/2013 7:35 AM  Attending:  I have seen and examined the patient with nurse practitioner/resident and agree with the note above.   Looks close to extubation, but weans poorly on 5/5.  I think that is mostly due to anxiety Will try to wean him when asleep today, continue diuresis Hopeful extubation within 24 hours Family updated at bedside  Ccm time 35 min   Heber Drew, MD [place] [facility] Pager: [phone] Cell: [phone] If no response, call [phone]

## 2013-08-23 NOTE — Progress Notes (Signed)
Wasted 50 mL Versed in sink -  Jodene NamMonica Cross, RN witnessed

## 2013-08-24 ENCOUNTER — Inpatient Hospital Stay (HOSPITAL_COMMUNITY): Payer: Medicare Other

## 2013-08-24 LAB — BASIC METABOLIC PANEL
BUN: 31 mg/dL — AB (ref 6–23)
CALCIUM: 8.6 mg/dL (ref 8.4–10.5)
CO2: >45 mEq/L (ref 19–32)
Chloride: 98 mEq/L (ref 96–112)
Creatinine, Ser: 0.67 mg/dL (ref 0.50–1.35)
GFR calc Af Amer: >90 mL/min (ref >90)
GFR calc non Af Amer: >90 mL/min (ref >90)
GLUCOSE: 199 mg/dL — AB (ref 70–99)
Potassium: 3.5 mEq/L — ABNORMAL LOW (ref 3.7–5.3)
Sodium: 149 mEq/L — ABNORMAL HIGH (ref 137–147)

## 2013-08-24 LAB — GLUCOSE, CAPILLARY
GLUCOSE-CAPILLARY: 185 mg/dL — AB (ref 70–99)
GLUCOSE-CAPILLARY: 191 mg/dL — AB (ref 70–99)
Glucose-Capillary: 115 mg/dL — ABNORMAL HIGH (ref 70–99)
Glucose-Capillary: 133 mg/dL — ABNORMAL HIGH (ref 70–99)
Glucose-Capillary: 144 mg/dL — ABNORMAL HIGH (ref 70–99)
Glucose-Capillary: 218 mg/dL — ABNORMAL HIGH (ref 70–99)

## 2013-08-24 LAB — CULTURE, BLOOD (ROUTINE X 2): CULTURE: NO GROWTH

## 2013-08-24 LAB — MAGNESIUM: Magnesium: 2 mg/dL (ref 1.5–2.5)

## 2013-08-24 LAB — CBC
HCT: 27.5 % — ABNORMAL LOW (ref 39.0–52.0)
Hemoglobin: 8.6 g/dL — ABNORMAL LOW (ref 13.0–17.0)
MCH: 32.3 pg (ref 26.0–34.0)
MCHC: 31.3 g/dL (ref 30.0–36.0)
MCV: 103.4 fL — AB (ref 78.0–100.0)
Platelets: 194 10*3/uL (ref 150–400)
RBC: 2.66 MIL/uL — AB (ref 4.22–5.81)
RDW: 16.9 % — ABNORMAL HIGH (ref 11.5–15.5)
WBC: 6.8 10*3/uL (ref 4.0–10.5)

## 2013-08-24 MED ORDER — DOCUSATE SODIUM 50 MG/5ML PO LIQD
100.0000 mg | Freq: Every day | ORAL | Status: DC
  Administered 2013-08-24 – 2013-08-26 (×3): 100 mg via ORAL
  Filled 2013-08-24 (×4): qty 10

## 2013-08-24 MED ORDER — FUROSEMIDE 10 MG/ML IJ SOLN
40.0000 mg | Freq: Once | INTRAMUSCULAR | Status: AC
  Administered 2013-08-24: 40 mg via INTRAVENOUS
  Filled 2013-08-24: qty 4

## 2013-08-24 MED ORDER — POTASSIUM CHLORIDE 20 MEQ/15ML (10%) PO LIQD
40.0000 meq | Freq: Once | ORAL | Status: AC
  Administered 2013-08-24: 40 meq
  Filled 2013-08-24: qty 30

## 2013-08-24 MED ORDER — POTASSIUM CHLORIDE 10 MEQ/100ML IV SOLN
10.0000 meq | INTRAVENOUS | Status: AC
  Administered 2013-08-24 (×4): 10 meq via INTRAVENOUS
  Filled 2013-08-24: qty 100

## 2013-08-24 MED ORDER — FUROSEMIDE 10 MG/ML IJ SOLN
40.0000 mg | Freq: Two times a day (BID) | INTRAMUSCULAR | Status: DC
  Administered 2013-08-24 – 2013-08-26 (×4): 40 mg via INTRAVENOUS
  Filled 2013-08-24 (×6): qty 4

## 2013-08-24 MED ORDER — PANTOPRAZOLE SODIUM 40 MG PO PACK
40.0000 mg | PACK | Freq: Every day | ORAL | Status: DC
  Administered 2013-08-24 – 2013-08-27 (×4): 40 mg
  Filled 2013-08-24 (×5): qty 20

## 2013-08-24 MED ORDER — DOCUSATE SODIUM 100 MG PO CAPS
100.0000 mg | ORAL_CAPSULE | Freq: Every day | ORAL | Status: DC
  Filled 2013-08-24: qty 1

## 2013-08-24 NOTE — Progress Notes (Signed)
PULMONARY / CRITICAL CARE MEDICINE   Name: [name] [name] MRN: [id number] DOB: 1940/03/06    ADMISSION DATE:  08/17/2013  PRIMARY SERVICE: PCCM  CHIEF COMPLAINT:  Respiratory distress.   BRIEF PATIENT DESCRIPTION:  73 y/o with DM, HTN and COPD. Presented to [facility] 6/27 with acute hypoxemic respiratory failure. Found to have Septic shock, ARDS.  SIGNIFICANT EVENTS / STUDIES:  6/27 - Admit w resp fx, cxr w bilateral patchy infiltrates.  [facility] & [facility]- paralyzed, peep 12, pressors required 6/30 - s/p bronch - suctioned complete obstruction b/l, NO HSV 7/01 - CXR (unchanged diffuse b/l edema / infiltrate) 7/02 - neg 4.6 liters 7/03 - hypoglycemia event (cbg 34), alert, on fentanyl only  LINES / TUBES: ETT 6/27>>> Left subclavian CVC 6/27 >>> 6/28 R rad a line>>>7/2  CULTURES: Bood cultures 6/27 >>> NGTD Tracheal aspirate 6/27 >>> negative Urine culture 6/27 >>> Neg Resp virus PCR 6/28 >>> Negative Urine leg 6/28 >>> Negative Urine strep 6/28 >>> Negative Bronch 6/30 >>> oropharyngeal flora  HSV >>>neg  ANTIBIOTICS: - Completed levaquin for COPD exacerbation PTA.  Zosyn 6/27>>>6/29 Imipenem 6/29>>> Vancomycin 6/27>>> 6/30 Rocephin 7/1 >>>  SUBJECTIVE:   Agitation overnight, trial wean on 12/5 per RT   VITAL SIGNS: Temp:  [98.6 F (37 C)-100.1 F (37.8 C)] 98.8 F (37.1 C) (07/04 0753) Pulse Rate:  [50-126] 72 (07/04 0753) Resp:  [11-28] 15 (07/04 0753) BP: (83-146)/(38-97) 92/45 mmHg (07/04 0700) SpO2:  [97 %-100 %] 99 % (07/04 0753) FiO2 (%):  [40 %] 40 % (07/04 0753) Weight:  [159 lb 13.3 oz (72.5 kg)] 159 lb 13.3 oz (72.5 kg) (07/04 0400) HEMODYNAMICS: CVP:  [8 mmHg-14 mmHg] 8 mmHg VENTILATOR SETTINGS: Vent Mode:  [-] CPAP;PSV FiO2 (%):  [40 %] 40 % Set Rate:  [12 bmp] 12 bmp Vt Set:  [440 mL] 440 mL PEEP:  [5 cmH20] 5 cmH20 Pressure Support:  [12 cmH20] 12 cmH20 Plateau Pressure:  [17 cmH20-35 cmH20] 35 cmH20 INTAKE /  OUTPUT: Intake/Output     07/03 0701 - 07/04 0700 07/04 0701 - 07/05 0700   I.V. (mL/kg) 703 (9.7)    NG/GT 1510    IV Piggyback 50    Total Intake(mL/kg) 2263 (31.2)    Urine (mL/kg/hr) 2520 (1.4)    Total Output 2520     Net -257          Stool Occurrence 1 x      PHYSICAL EXAMINATION: General: elderly man, in NAD on vent Neuro: follows commands intermittently, rass -1 HEENT: OETT, jvd, arcus senilis  PULM: resp's even/non-labored, lungs bilaterally coarse rhonchi  CV: s1s2  rrr no m GI: soft , bs wnl , non-tender on exam  Extremities: no edema   LABS:   PULMONARY  Recent Labs Lab 08/18/13 1534 08/18/13 2247 08/19/13 1045 08/20/13 0500 08/21/13 0340  PHART 7.227* 7.299* 7.355 7.424 7.461*  PCO2ART 54.5* 47.4* 42.8 38.4 39.2  PO2ART 85.0 76.0* 89.0 79.1* 78.4*  HCO3 22.4 23.2 23.9 24.9* 27.6*  TCO2 24 25 25  26.1 28.8  O2SAT 93.0 93.0 96.0 99.1 96.3   CBC  Recent Labs Lab 08/21/13 0530 08/23/13 0410 08/24/13 0500  HGB 8.2* 9.1* 8.6*  HCT 23.4* 29.0* 27.5*  WBC 13.2* 9.1 6.8  PLT 166 171 194   COAGULATION  Recent Labs Lab 08/17/13 1915  INR 1.09   CARDIAC  Recent Labs Lab 08/17/13 1835 08/18/13 1525 08/19/13 0426  TROPONINI <0.30 <0.30 <0.30  Recent Labs Lab 08/18/13 0100  PROBNP 571.3*   CHEMISTRY  Recent Labs Lab 08/17/13 1835 08/18/13 0100 08/18/13 1520  08/20/13 0407 08/21/13 0530 08/22/13 0740 08/23/13 0410 08/24/13 0500  NA 122* 129* 129*  < > 136* 138 147 150* 149*  K 6.3* 3.8 4.3  < > 3.8 3.7 3.7 3.6* 3.5*  CL 78* 92* 95*  < > 101 101 100 100 98  CO2 31 26 22   < > 23 27 38* 44* >45*  GLUCOSE 226* 136* 214*  < > 146* 176* 184* 34* 199*  BUN 24* 20 20  < > 16 18 24* 27* 31*  CREATININE 0.90 0.74 0.99  < > 0.71 0.71 0.65 0.70 0.67  CALCIUM 9.1 7.2* 7.6*  < > 8.2* 8.3* 8.9 9.0 8.6  MG  --  1.3* 1.6  --   --   --   --   --   --   PHOS  --  2.7 3.4  --   --   --   --   --   --   < > = values in this interval not  displayed. Estimated Creatinine Clearance: 84.3 ml/min (by C-G formula based on Cr of 0.67).  LIVER  Recent Labs Lab 08/17/13 1835 08/17/13 1915 08/18/13 0100 08/19/13 0426 08/20/13 0407  AST 30  --  16 12 22   ALT 33  --  17 14 16   ALKPHOS 73  --  42 148* 46  BILITOT 0.4  --  0.6 0.3 <0.2*  PROT 7.8  --  4.6* 4.9* 4.8*  ALBUMIN 4.0  --  2.3* 1.9* 1.6*  INR  --  1.09  --   --   --    INFECTIOUS  Recent Labs Lab 08/17/13 1855 08/18/13 0100 08/18/13 1517 08/19/13 0426 08/20/13 0407  LATICACIDVEN 2.15 1.5  --   --   --   PROCALCITON  --   --  27.88 15.91 7.93   ENDOCRINE CBG (last 3)   Recent Labs  08/23/13 2358 08/24/13 0428 08/24/13 0714  GLUCAP 144* 185* 133*   IMAGING x48h  Dg Chest Port 1 View  08/23/2013   CLINICAL DATA:  Endotracheal tube adjustment  EXAM: PORTABLE CHEST - 1 VIEW  COMPARISON:  08/22/2013  FINDINGS: Endotracheal tube has its tip 2.5 cm above the carina. Soft drainage tube enters the abdomen. Left subclavian central line has its tip in the SVC 3 cm above the right atrium. Persistent infiltrates in both lower lobes consistent with pneumonia. No apparent change.  IMPRESSION: Lines and tubes well positioned. Endotracheal tip 2.5 cm above the carina. Persistent bilateral lower lobe pneumonia.   Electronically Signed   By: Paulina Fusi M.D.   On: 08/23/2013 12:06   Dg Chest Port 1 View  08/22/2013   CLINICAL DATA:  Pulmonary edema, history hypertension, diabetes, COPD  EXAM: PORTABLE CHEST - 1 VIEW  COMPARISON:  Portable exam 1233 hr compared to 08/21/2013  FINDINGS: Tip of endotracheal tube projects approximately 6.4 cm above carina.  Feeding tube extends into stomach.  LEFT subclavian central venous catheter tip projects over SVC.  Slight rotation to the RIGHT.  Stable heart size.  Scattered BILATERAL pulmonary infiltrates could represent edema and/or infection.  No gross pleural effusion or pneumothorax.  IMPRESSION: Persistent pulmonary infiltrates.    Electronically Signed   By: Ulyses Southward M.D.   On: 08/22/2013 12:47    ASSESSMENT / PLAN:  PULMONARY A: Acute hypoxemic and hypercarbic respiratory failure. Respiratory  Acidosis - Improved (CO2 39) Bilateral patchy infiltrates compatible with ARDS  > now improving, some degree of pulm edema COPD.   P:   -continue lasix, increase dose 7/4 -Daily SBT / WUA -PRN levalbuterol nebs -upright position -pcxr in am  -consider ABG assessment if change in mental status, noted rising cO2  CARDIOVASCULAR A:   Hypotension - secondary to sepsis  Diastolic Dysfunction - ECHO (LVEF 60-65%, Grade 1 Diastolic dysfunction) P:  -Trend CVP -goal neg balance -continue norvasc  RENAL A:   Creatinine - Stable Hyponatremia - Resolved Hypokalemia P:   -continue lasix 10 mg BID -replace K 7/4 -Free water 200 ml Q8  GASTROINTESTINAL A:   N/V - monitor for aspiration Constipation  P:   - Protonix for GI prophylaxis - TF initiated 6/29 - oxepa - bowel regimen   HEMATOLOGIC A:   Leukocytosis Anemia  P:  - SQ hep  INFECTIOUS A:   Aspiration pneumonia vs HCAP  - WBC improved 22.2 > 13.2 S/p Bronch 6/30 - culture & viral studies negative P:   -ceftriaxone stop date in place  ENDOCRINE A:   DM Hypoglycemia P:   -SSI  NEUROLOGIC A:   Acute Encephalopathy Delirium  P:   -RASS goal: 0 -Fentanyl drip -WUA -Risperdal   GLOBAL: -no family available this am -Full code -continue weaning efforts   Canary BrimBrandi Ollis, NP-C [place] [facility] Pgr: [phone] or [phone]  Attending:  I have seen and examined the patient with nurse practitioner/resident and agree with the note above.   Still very weak but CXR improving.  I hope that he can be extubated in the next 24 hours.  I asked his wife about goals of care today and she feels like he would want to be reintubated should he fail extubation.  Will discuss with son more.  CC time 35 minutes  Heber CarolinaBrent McQuaid,  MD  PCCM Pager: [phone] Cell: [phone] If no response, call [phone]     08/24/2013 8:24 AM

## 2013-08-25 ENCOUNTER — Inpatient Hospital Stay (HOSPITAL_COMMUNITY): Payer: Medicare Other

## 2013-08-25 DIAGNOSIS — E870 Hyperosmolality and hypernatremia: Secondary | ICD-10-CM

## 2013-08-25 LAB — GLUCOSE, CAPILLARY
GLUCOSE-CAPILLARY: 165 mg/dL — AB (ref 70–99)
GLUCOSE-CAPILLARY: 177 mg/dL — AB (ref 70–99)
GLUCOSE-CAPILLARY: 178 mg/dL — AB (ref 70–99)
Glucose-Capillary: 136 mg/dL — ABNORMAL HIGH (ref 70–99)
Glucose-Capillary: 242 mg/dL — ABNORMAL HIGH (ref 70–99)
Glucose-Capillary: 148 mg/dL — ABNORMAL HIGH (ref 70–99)

## 2013-08-25 LAB — CBC
HEMATOCRIT: 28.7 % — AB (ref 39.0–52.0)
HEMOGLOBIN: 8.8 g/dL — AB (ref 13.0–17.0)
MCH: 31.8 pg (ref 26.0–34.0)
MCHC: 30.7 g/dL (ref 30.0–36.0)
MCV: 103.6 fL — ABNORMAL HIGH (ref 78.0–100.0)
Platelets: 239 10*3/uL (ref 150–400)
RBC: 2.77 MIL/uL — AB (ref 4.22–5.81)
RDW: 16.7 % — ABNORMAL HIGH (ref 11.5–15.5)
WBC: 6.3 10*3/uL (ref 4.0–10.5)

## 2013-08-25 LAB — BASIC METABOLIC PANEL
BUN: 37 mg/dL — AB (ref 6–23)
CHLORIDE: 96 meq/L (ref 96–112)
CO2: >45 mEq/L (ref 19–32)
Calcium: 8.9 mg/dL (ref 8.4–10.5)
Creatinine, Ser: 0.76 mg/dL (ref 0.50–1.35)
GFR calc Af Amer: >90 mL/min (ref >90)
GFR calc non Af Amer: 88 mL/min — ABNORMAL LOW (ref >90)
GLUCOSE: 172 mg/dL — AB (ref 70–99)
POTASSIUM: 3.7 meq/L (ref 3.7–5.3)
Sodium: 150 mEq/L — ABNORMAL HIGH (ref 137–147)

## 2013-08-25 LAB — MAGNESIUM: Magnesium: 2.1 mg/dL (ref 1.5–2.5)

## 2013-08-25 MED ORDER — POTASSIUM CHLORIDE 20 MEQ/15ML (10%) PO LIQD
20.0000 meq | ORAL | Status: DC
  Administered 2013-08-25: 20 meq
  Filled 2013-08-25: qty 15

## 2013-08-25 MED ORDER — POTASSIUM CHLORIDE 20 MEQ/15ML (10%) PO LIQD
20.0000 meq | Freq: Every day | ORAL | Status: DC
Start: 2013-08-26 — End: 2013-08-26
  Filled 2013-08-25: qty 15

## 2013-08-25 MED ORDER — FREE WATER
300.0000 mL | Freq: Three times a day (TID) | Status: DC
  Administered 2013-08-25 – 2013-08-26 (×3): 300 mL

## 2013-08-25 NOTE — Progress Notes (Signed)
PULMONARY / CRITICAL CARE MEDICINE   Name: [name] [name] MRN: [id number] DOB: 04/30/1940    ADMISSION DATE:  08/17/2013  PRIMARY SERVICE: PCCM  CHIEF COMPLAINT:  Respiratory distress.   BRIEF PATIENT DESCRIPTION:  73 y/o with DM, HTN and COPD. Presented to [facility] 6/27 with acute hypoxemic respiratory failure. Found to have Septic shock, ARDS.  SIGNIFICANT EVENTS / STUDIES:  6/27 - Admit w resp fx, cxr w bilateral patchy infiltrates.  [facility] - paralyzed, peep 12, pressors required 6/30 - s/p bronch - suctioned complete obstruction b/l, NO HSV 7/01 - CXR (unchanged diffuse b/l edema / infiltrate) 7/02 - neg 4.6 liters 7/03 - hypoglycemia event (cbg 34), alert, on fentanyl only 7/04 - weaned most of day, marginal mental status 7/05 - weaning PSV (low volumes, mild tachypnea)  LINES / TUBES: ETT 6/27>>> Left subclavian CVC 6/27 >>> 6/28 R rad a line>>>7/2  CULTURES: Bood cultures 6/27 >>> NGTD Tracheal aspirate 6/27 >>> negative Urine culture 6/27 >>> Neg Resp virus PCR 6/28 >>> Negative Urine leg 6/28 >>> Negative Urine strep 6/28 >>> Negative Bronch 6/30 >>> oropharyngeal flora   HSV >>> neg  ANTIBIOTICS: - Completed levaquin for COPD exacerbation PTA.  Zosyn 6/27>>>6/29 Imipenem 6/29>>>7/1 Vancomycin 6/27>>> 6/30 Rocephin 7/1 >>>  SUBJECTIVE:   RN reports improved mental status.  Mod thin white secretions   VITAL SIGNS: Temp:  [98.5 F (36.9 C)-99.4 F (37.4 C)] 99.3 F (37.4 C) (07/05 0403) Pulse Rate:  [76-113] 101 (07/05 0800) Resp:  [12-38] 31 (07/05 0800) BP: (83-177)/(41-96) 169/67 mmHg (07/05 0800) SpO2:  [96 %-100 %] 98 % (07/05 0800) FiO2 (%):  [40 %] 40 % (07/05 0750) Weight:  [152 lb 1.9 oz (69 kg)] 152 lb 1.9 oz (69 kg) (07/05 0403) HEMODYNAMICS: CVP:  [6 mmHg-9 mmHg] 6 mmHg VENTILATOR SETTINGS: Vent Mode:  [-] CPAP;PSV FiO2 (%):  [40 %] 40 % Set Rate:  [12 bmp] 12 bmp Vt Set:  [440 mL] 440 mL PEEP:   [5 cmH20] 5 cmH20 Pressure Support:  [5 cmH20-12 cmH20] 5 cmH20 Plateau Pressure:  [19 cmH20-21 cmH20] 19 cmH20 INTAKE / OUTPUT: Intake/Output     07/04 0701 - 07/05 0700 07/05 0701 - 07/06 0700   I.V. (mL/kg) 370 (5.4) 15 (0.2)   NG/GT 1870 265   IV Piggyback 450    Total Intake(mL/kg) 2690 (39) 280 (4.1)   Urine (mL/kg/hr) 5775 (3.5)    Total Output 5775     Net -3085 +280        Stool Occurrence 1 x      PHYSICAL EXAMINATION: General: elderly man, in NAD on vent Neuro: follows commands intermittently, rass 0 HEENT: OETT, jvd, arcus senilis  PULM: resp's even/non-labored, lungs bilaterally coarse rhonchi  CV: s1s2  rrr no m GI: soft , bs wnl , non-tender on exam  Extremities: no edema   LABS:   PULMONARY  Recent Labs Lab 08/18/13 1534 08/18/13 2247 08/19/13 1045 08/20/13 0500 08/21/13 0340  PHART 7.227* 7.299* 7.355 7.424 7.461*  PCO2ART 54.5* 47.4* 42.8 38.4 39.2  PO2ART 85.0 76.0* 89.0 79.1* 78.4*  HCO3 22.4 23.2 23.9 24.9* 27.6*  TCO2 24 25 25  26.1 28.8  O2SAT 93.0 93.0 96.0 99.1 96.3   CBC  Recent Labs Lab 08/23/13 0410 08/24/13 0500 08/25/13 0400  HGB 9.1* 8.6* 8.8*  HCT 29.0* 27.5* 28.7*  WBC 9.1 6.8 6.3  PLT 171 194 239   COAGULATION No results found for this basename:  INR,  in the last 168 hours CARDIAC  Recent Labs Lab 08/18/13 1525 08/19/13 0426  TROPONINI <0.30 <0.30   No results found for this basename: PROBNP,  in the last 168 hours CHEMISTRY  Recent Labs Lab 08/18/13 1520  08/21/13 0530 08/22/13 0740 08/23/13 0410 08/24/13 0500 08/25/13 0400  NA 129*  < > 138 147 150* 149* 150*  K 4.3  < > 3.7 3.7 3.6* 3.5* 3.7  CL 95*  < > 101 100 100 98 96  CO2 22  < > 27 38* 44* >45* >45*  GLUCOSE 214*  < > 176* 184* 34* 199* 172*  BUN 20  < > 18 24* 27* 31* 37*  CREATININE 0.99  < > 0.71 0.65 0.70 0.67 0.76  CALCIUM 7.6*  < > 8.3* 8.9 9.0 8.6 8.9  MG 1.6  --   --   --   --  2.0 2.1  PHOS 3.4  --   --   --   --   --   --   < >  = values in this interval not displayed. Estimated Creatinine Clearance: 80.3 ml/min (by C-G formula based on Cr of 0.76).  LIVER  Recent Labs Lab 08/19/13 0426 08/20/13 0407  AST 12 22  ALT 14 16  ALKPHOS 148* 46  BILITOT 0.3 <0.2*  PROT 4.9* 4.8*  ALBUMIN 1.9* 1.6*   INFECTIOUS  Recent Labs Lab 08/18/13 1517 08/19/13 0426 08/20/13 0407  PROCALCITON 27.88 15.91 7.93   ENDOCRINE CBG (last 3)   Recent Labs  08/24/13 2336 08/25/13 0413 08/25/13 0702  GLUCAP 178* 177* 136*   IMAGING x48h  Dg Chest Port 1 View  08/25/2013   CLINICAL DATA:  Check endotracheal tube placement  EXAM: PORTABLE CHEST - 1 VIEW  COMPARISON:  08/24/2013  FINDINGS: Cardiac shadow is stable. A left subclavian central line is again identified and stable. An endotracheal tube is seen 3.1 cm above the carina. A feeding catheter is noted within the proximal small bowel. Bibasilar atelectatic changes are noted worse on the left than the right. These are stable from the prior study. Mild interstitial changes are again seen. Patchy atelectatic changes are now noted in the right upper lobe just above the minor fissure.  IMPRESSION: Tubes and lines as described above.  Stable bibasilar changes.  Increasing right upper lobe atelectatic changes   Electronically Signed   By: Alcide Clever M.D.   On: 08/25/2013 07:33   Dg Chest Port 1 View  08/24/2013   CLINICAL DATA:  Intubated.  EXAM: PORTABLE CHEST - 1 VIEW  COMPARISON:  Yesterday.  FINDINGS: Endotracheal tube in satisfactory position. Feeding tube extending into the stomach. Stable left subclavian catheter. Normal sized heart. Decreased opacity at both lung bases. Stable prominent interstitial markings. Thoracic spine degenerative changes.  IMPRESSION: 1. Decreased bibasilar atelectasis. 2. Stable interstitial lung disease.   Electronically Signed   By: Gordan Payment M.D.   On: 08/24/2013 08:45   Dg Chest Port 1 View  08/23/2013   CLINICAL DATA:  Endotracheal tube  adjustment  EXAM: PORTABLE CHEST - 1 VIEW  COMPARISON:  08/22/2013  FINDINGS: Endotracheal tube has its tip 2.5 cm above the carina. Soft drainage tube enters the abdomen. Left subclavian central line has its tip in the SVC 3 cm above the right atrium. Persistent infiltrates in both lower lobes consistent with pneumonia. No apparent change.  IMPRESSION: Lines and tubes well positioned. Endotracheal tip 2.5 cm above the carina. Persistent bilateral lower  lobe pneumonia.   Electronically Signed   By: Paulina FusiMark  Shogry M.D.   On: 08/23/2013 12:06    ASSESSMENT / PLAN:  PULMONARY A: Acute hypoxemic and hypercarbic respiratory failure. Respiratory Acidosis - Improved (CO2 39) Bilateral patchy infiltrates compatible with ARDS  > now improving, some degree of pulm edema COPD.   P:   -continue lasix, increase dose 7/4 to 40 BID -Daily SBT / WUA -PRN levalbuterol nebs -upright position -pcxr in am  -consider ABG assessment if change in mental status, noted rising cO2 on BMP  CARDIOVASCULAR A:   Hypotension - secondary to sepsis, resolved.  Diastolic Dysfunction - ECHO (LVEF 60-65%, Grade 1 Diastolic dysfunction) Hypertension P:  -Trend CVP -goal neg balance -continue norvasc  RENAL A:   Creatinine - Stable Hyponatremia - Resolved Hypernatremia  Hypokalemia P:   -continue lasix 40 mg BID -20 mEq KCL per day -Free water to 300 ml Q8  GASTROINTESTINAL A:   N/V - monitor for aspiration Constipation  P:   - Protonix for GI prophylaxis - TF initiated 6/29 - oxepa - bowel regimen   HEMATOLOGIC A:   Leukocytosis Anemia  P:  - SQ hep  INFECTIOUS A:   Aspiration pneumonia vs HCAP  - WBC improved 22.2 > 13.2 S/p Bronch 6/30 - culture & viral studies negative P:   -ceftriaxone stop date in place  ENDOCRINE A:   DM Hypoglycemia P:   -SSI  NEUROLOGIC A:   Acute Encephalopathy Delirium  P:   -RASS goal: 0 -Fentanyl drip -WUA -Risperdal  -PRN haldol   GLOBAL: -Full  code.  Wife feels like he would want to be re-intubated if he should fail extubation.  Ongoing discussion with son.  -continue weaning efforts, hopeful to extubate today or tomorrow   Canary BrimBrandi Ollis, NP-C [facility] Pgr: [phone] or [phone]  Attending:  I have seen and examined the patient with nurse practitioner/resident and agree with the note above.   Still very weak on PSV 12/5, CXR improving with diuresis Continue diuresis  CC time 35 minutes  Heber CarolinaBrent Azul Brumett, MD [place] PCCM Pager: [phone] Cell: [phone] If no response, call [phone]     08/25/2013 8:06 AM

## 2013-08-25 NOTE — Progress Notes (Signed)
  CRITICAL VALUE ALERT  Critical value received:  CO2>45  Date of notification:  08/25/2013   Time of notification:  6:27 AM   Critical value read back:Yes.    Nurse who received alert:  Berkley Harveyhristine Keylan Costabile, RN  MD notified (1st page):  Dr. Darrick Pennaeterding Pola Corn(Elink)  Time of first page:  [phone]  MD notified (2nd page):  Time of second page:  Responding MD:  Dr. Darrick Pennaeterding Pola Corn(Elink)  Time MD responded:  [phone]

## 2013-08-25 NOTE — Progress Notes (Signed)
[facility] FOR AM LAB REPLACEMENT ONLY  The patient does apply for the [facility] Electrolyte [facility] Protocol based on the criteria listed below:   1. Is GFR >/= 40 ml/min? Yes.    Patient's GFR today is >90 2. Is urine output >/= 0.5 ml/kg/hr for the last 6 hours? Yes.   Patient's UOP is 4.2 ml/kg/hr 3. Is BUN < 60 mg/dL? Yes.    Patient's BUN today is 37 4. Abnormal electrolyte(s) K+ 3.7 5. Ordered repletion with: see order 6. If a panic level lab has been reported, has the CCM MD in charge been notified? Yes.  .   Physician:  Dr. Higinio Planeterding  Chantee Cerino A 08/25/2013 6:26 AM

## 2013-08-26 ENCOUNTER — Inpatient Hospital Stay (HOSPITAL_COMMUNITY): Payer: Medicare Other

## 2013-08-26 DIAGNOSIS — J9589 Other postprocedural complications and disorders of respiratory system, not elsewhere classified: Secondary | ICD-10-CM

## 2013-08-26 LAB — CBC
HEMATOCRIT: 32.1 % — AB (ref 39.0–52.0)
Hemoglobin: 10 g/dL — ABNORMAL LOW (ref 13.0–17.0)
MCH: 32.5 pg (ref 26.0–34.0)
MCHC: 31.2 g/dL (ref 30.0–36.0)
MCV: 104.2 fL — ABNORMAL HIGH (ref 78.0–100.0)
PLATELETS: 288 10*3/uL (ref 150–400)
RBC: 3.08 MIL/uL — ABNORMAL LOW (ref 4.22–5.81)
RDW: 16.9 % — AB (ref 11.5–15.5)
WBC: 8.9 10*3/uL (ref 4.0–10.5)

## 2013-08-26 LAB — BASIC METABOLIC PANEL
BUN: 46 mg/dL — ABNORMAL HIGH (ref 6–23)
CO2: >45 mEq/L (ref 19–32)
CREATININE: 0.81 mg/dL (ref 0.50–1.35)
Calcium: 9.1 mg/dL (ref 8.4–10.5)
Chloride: 97 mEq/L (ref 96–112)
GFR calc Af Amer: >90 mL/min (ref >90)
GFR, EST NON AFRICAN AMERICAN: 86 mL/min — AB (ref >90)
GLUCOSE: 171 mg/dL — AB (ref 70–99)
Potassium: 3.7 mEq/L (ref 3.7–5.3)
SODIUM: 151 meq/L — AB (ref 137–147)

## 2013-08-26 LAB — GLUCOSE, CAPILLARY
Glucose-Capillary: 166 mg/dL — ABNORMAL HIGH (ref 70–99)
Glucose-Capillary: 145 mg/dL — ABNORMAL HIGH (ref 70–99)
Glucose-Capillary: 135 mg/dL — ABNORMAL HIGH (ref 70–99)
Glucose-Capillary: 244 mg/dL — ABNORMAL HIGH (ref 70–99)
Glucose-Capillary: 179 mg/dL — ABNORMAL HIGH (ref 70–99)
Glucose-Capillary: 190 mg/dL — ABNORMAL HIGH (ref 70–99)

## 2013-08-26 MED ORDER — INSULIN ASPART 100 UNIT/ML ~~LOC~~ SOLN
0.0000 [IU] | SUBCUTANEOUS | Status: DC
  Administered 2013-08-26: 5 [IU] via SUBCUTANEOUS
  Administered 2013-08-26 (×2): 3 [IU] via SUBCUTANEOUS
  Administered 2013-08-27: 5 [IU] via SUBCUTANEOUS
  Administered 2013-08-27 (×2): 3 [IU] via SUBCUTANEOUS
  Administered 2013-08-27: 2 [IU] via SUBCUTANEOUS
  Administered 2013-08-28: 5 [IU] via SUBCUTANEOUS
  Administered 2013-08-28: 2 [IU] via SUBCUTANEOUS
  Administered 2013-08-28 (×2): 3 [IU] via SUBCUTANEOUS
  Administered 2013-08-28: 2 [IU] via SUBCUTANEOUS
  Administered 2013-08-28 – 2013-08-29 (×2): 3 [IU] via SUBCUTANEOUS
  Administered 2013-08-29: 2 [IU] via SUBCUTANEOUS
  Administered 2013-08-29: 5 [IU] via SUBCUTANEOUS
  Administered 2013-08-29: 3 [IU] via SUBCUTANEOUS
  Administered 2013-08-29: 2 [IU] via SUBCUTANEOUS
  Administered 2013-08-30 (×2): 3 [IU] via SUBCUTANEOUS
  Administered 2013-08-30: 5 [IU] via SUBCUTANEOUS
  Administered 2013-08-30: 3 [IU] via SUBCUTANEOUS
  Administered 2013-08-30: 2 [IU] via SUBCUTANEOUS
  Administered 2013-08-30: 5 [IU] via SUBCUTANEOUS
  Administered 2013-08-31: 2 [IU] via SUBCUTANEOUS
  Administered 2013-08-31 (×2): 3 [IU] via SUBCUTANEOUS
  Administered 2013-08-31: 2 [IU] via SUBCUTANEOUS
  Administered 2013-08-31: 3 [IU] via SUBCUTANEOUS
  Administered 2013-09-01 (×2): 2 [IU] via SUBCUTANEOUS
  Administered 2013-09-01 (×3): 3 [IU] via SUBCUTANEOUS
  Administered 2013-09-01: 5 [IU] via SUBCUTANEOUS
  Administered 2013-09-02 (×3): 3 [IU] via SUBCUTANEOUS
  Administered 2013-09-02: 5 [IU] via SUBCUTANEOUS
  Administered 2013-09-03 (×4): 3 [IU] via SUBCUTANEOUS
  Administered 2013-09-03 (×2): 8 [IU] via SUBCUTANEOUS
  Administered 2013-09-04: 5 [IU] via SUBCUTANEOUS
  Administered 2013-09-04: 3 [IU] via SUBCUTANEOUS
  Administered 2013-09-04 (×2): 5 [IU] via SUBCUTANEOUS
  Administered 2013-09-04: 3 [IU] via SUBCUTANEOUS
  Administered 2013-09-04 – 2013-09-05 (×2): 2 [IU] via SUBCUTANEOUS
  Administered 2013-09-05: 3 [IU] via SUBCUTANEOUS
  Administered 2013-09-05: 2 [IU] via SUBCUTANEOUS
  Administered 2013-09-06: 5 [IU] via SUBCUTANEOUS

## 2013-08-26 MED ORDER — PNEUMOCOCCAL VAC POLYVALENT 25 MCG/0.5ML IJ INJ
0.5000 mL | INJECTION | INTRAMUSCULAR | Status: AC
  Administered 2013-08-27: 0.5 mL via INTRAMUSCULAR
  Filled 2013-08-26: qty 0.5

## 2013-08-26 MED ORDER — FREE WATER
600.0000 mL | Freq: Three times a day (TID) | Status: DC
  Administered 2013-08-26 – 2013-08-27 (×3): 600 mL

## 2013-08-26 MED ORDER — POTASSIUM CHLORIDE 20 MEQ/15ML (10%) PO LIQD
40.0000 meq | Freq: Two times a day (BID) | ORAL | Status: AC
  Administered 2013-08-26 – 2013-08-27 (×4): 40 meq
  Filled 2013-08-26 (×4): qty 30

## 2013-08-26 NOTE — Progress Notes (Signed)
PULMONARY / CRITICAL CARE MEDICINE   Name: [name] [name] MRN: [id number] DOB: 10/24/1940    ADMISSION DATE:  08/17/2013  PRIMARY SERVICE: PCCM  CHIEF COMPLAINT:  Respiratory distress.   BRIEF PATIENT DESCRIPTION:  73 y/o with DM, HTN and COPD. Presented from [facility] 6/27 with acute hypoxemic respiratory failure, secondary to ARDS in setting of septic shock.  SIGNIFICANT EVENTS / STUDIES:  6/27 - Admit w resp fx, cxr w bilateral patchy infiltrates.  [facility] - ECHO (LVEF 60-65%, Grade 1 Diastolic dysfunction) 6/29 - paralyzed, peep 12, pressors required 6/30 - s/p bronch - suctioned complete obstruction b/l, NO HSV 7/1 - CXR (unchanged diffuse b/l edema / infiltrate) 7/2 - neg 4.6 liters 7/3 - hypoglycemia event (cbg 34), alert, on fentanyl only 7/4 - weaned most of day, marginal mental status 7/5 - weaning PSV (low volumes, mild tachypnea) 7/6 - continue wean with poor progress, CXR significantly improved aeration, b/l lower lobe atelectasis  LINES / TUBES: 6/27 ETT >>  6/27 L [place] CVC >>  6/28 R rad a line>>>7/2  CULTURES: Bood cultures 6/27 >>> Negative Tracheal aspirate 6/27 >>> negative Urine culture 6/27 >>> Neg Resp virus PCR 6/28 >>> Negative Urine leg 6/28 >>> Negative Urine strep 6/28 >>> Negative Bronch 6/30 >>> oropharyngeal flora, HSV neg  ANTIBIOTICS: - Completed levaquin for COPD exacerbation PTA.  Zosyn 6/27>>>6/29 Imipenem 6/29>>>7/1 Vancomycin 6/27>>> 6/30 Rocephin 7/1 >>> (set to DC on 7/8)  SUBJECTIVE:  Improved mental status on weaned Fentanyl sedation. Remains on vent.  VITAL SIGNS: Temp:  [98.4 F (36.9 C)-99.4 F (37.4 C)] 98.8 F (37.1 C) (07/06 0400) Pulse Rate:  [82-112] 112 (07/06 1112) Resp:  [12-40] 31 (07/06 1112) BP: (78-121)/(43-66) 95/58 mmHg (07/06 1112) SpO2:  [97 %-100 %] 99 % (07/06 0900) FiO2 (%):  [30 %-40 %] 30 % (07/06 1112) Weight:  [149 lb 4 oz (67.7 kg)] 149 lb 4 oz (67.7 kg)  (07/06 0400) HEMODYNAMICS: CVP:  [5 mmHg-8 mmHg] 7 mmHg VENTILATOR SETTINGS: Vent Mode:  [-] PRVC FiO2 (%):  [30 %-40 %] 30 % Set Rate:  [12 bmp] 12 bmp Vt Set:  [440 mL] 440 mL PEEP:  [5 cmH20] 5 cmH20 Pressure Support:  [10 cmH20] 10 cmH20 Plateau Pressure:  [16 cmH20-21 cmH20] 16 cmH20 INTAKE / OUTPUT: Intake/Output     07/05 0701 - 07/06 0700 07/06 0701 - 07/07 0700   I.V. (mL/kg) 200 (3) 70 (1)   NG/GT 1350 160   IV Piggyback 50    Total Intake(mL/kg) 1600 (23.6) 230 (3.4)   Urine (mL/kg/hr) 4065 (2.5) 1100 (3.2)   Total Output 4065 1100   Net -2465 -870          PHYSICAL EXAMINATION: General: NAD on vent Neuro: follows commands, grossly non-focal, non-verbal yes/no, rass 0 HEENT: OETT PULM: improved sounds and air mvmnt b/l, bibasilar coarse rhonchi > at bases, intermittent tachypnea, no distress CV: RRR, no murmurs GI: soft , non-tender, non-distended, +active BS Extremities: no edema, non-tender  LABS: I have reviewed all of today's lab results. Relevant abnormalities are discussed in the A/P section  ASSESSMENT / PLAN:  PULMONARY A: Acute hypoxemic and hypercarbic respiratory failure - remains on vent ARDS, bilateral patchy infiltrates - Improved on CXR 7/6 COPD P:   - continue wean on PSV (up to 10/5), seems unable to tolerate 5/5, with low TVs and inc RR, given prolonged wean despite clinical improvement, ?neuromuscular component from initial paralytic, however appropriate  muscle tone - Anticipate cont wean 1-2 more days, consider trial extubation (if needed, then trach) vs extubate to short-term trach - Discontinue Lasix - PRN levalbuterol nebs - Start PT to mobilize, out of bed  CARDIOVASCULAR A: Septic Shock / Hypotension - Resolved Diastolic CHF Hypertension P:  -Trend CVP -goal neg balance, 4L out in 24 hrs -continue norvasc 5mg  PO  RENAL A:   Creatinine - Stable Hypernatremia - trending up, 151 Metabolic Alkalosis - correcting  K Hypokalemia - Improved Hyponatremia - Resolved P:   - Discontinued Lasix (as above) - Replace K, 40 mEq PO BID - Inc free water to 600 ml Q8 - BMET in AM  GASTROINTESTINAL A:   N/V - Resolved Constipation  P:   - Protonix for GI prophylaxis - Continue TF with Oxepa (for ARDS) 6/29 >> - bowel regimen  HEMATOLOGIC A:   Leukocytosis - Resolved (peak 22.2 on 6/29) Anemia - stable P:  - SQ hep  INFECTIOUS A:   Aspiration pneumonia vs HCAP S/p Bronch 6/30 - culture & viral studies negative P:   -Continue Ceftriaxone (7/1 >> 7/8)  ENDOCRINE A:   DM Hypoglycemia P:   -SSI  NEUROLOGIC A:   Acute Encephalopathy Delirium  P:   -RASS goal: 0 -Fentanyl drip -WUA -Risperdal 0.5mg  BID -PRN haldol   GLOBAL: -Full code.   Plan to continue wean vent with significant clinical improved ARDS, HCAP, poor candidate for extubation, anticipate 1-2 more days wean, consider trial extubation vs short-term trach. If need reintubation would recommend short-term trach.  Saralyn PilarAlexander Karamalegos, DO [facility], PGY-2  PCCM ATTENDING: I have interviewed and examined the patient and reviewed the database. I have formulated the assessment and plan as reflected in the note above with amendments made by me. 40 mins of direct critical care time provided. Wife updated @ bedside in detail  [name] Fischeravid Infant Zink, MD;  PCCM service; Mobile [phone]   08/26/2013 12:06 PM

## 2013-08-26 NOTE — Progress Notes (Signed)
Foley removed per nurse driven removal protocol, condom cath placed.

## 2013-08-27 DIAGNOSIS — A419 Sepsis, unspecified organism: Secondary | ICD-10-CM | POA: Diagnosis not present

## 2013-08-27 LAB — GLUCOSE, CAPILLARY
GLUCOSE-CAPILLARY: 144 mg/dL — AB (ref 70–99)
GLUCOSE-CAPILLARY: 158 mg/dL — AB (ref 70–99)
Glucose-Capillary: 114 mg/dL — ABNORMAL HIGH (ref 70–99)
Glucose-Capillary: 226 mg/dL — ABNORMAL HIGH (ref 70–99)
Glucose-Capillary: 176 mg/dL — ABNORMAL HIGH (ref 70–99)
Glucose-Capillary: 118 mg/dL — ABNORMAL HIGH (ref 70–99)

## 2013-08-27 LAB — BASIC METABOLIC PANEL
Anion gap: 9 (ref 5–15)
BUN: 43 mg/dL — AB (ref 6–23)
CHLORIDE: 98 meq/L (ref 96–112)
CO2: 41 mEq/L (ref 19–32)
Calcium: 8.6 mg/dL (ref 8.4–10.5)
Creatinine, Ser: 0.79 mg/dL (ref 0.50–1.35)
GFR calc non Af Amer: 87 mL/min — ABNORMAL LOW (ref >90)
GLUCOSE: 166 mg/dL — AB (ref 70–99)
Potassium: 4.1 mEq/L (ref 3.7–5.3)
SODIUM: 148 meq/L — AB (ref 137–147)

## 2013-08-27 LAB — CBC
HCT: 30.3 % — ABNORMAL LOW (ref 39.0–52.0)
HEMOGLOBIN: 9.4 g/dL — AB (ref 13.0–17.0)
MCH: 32 pg (ref 26.0–34.0)
MCHC: 31 g/dL (ref 30.0–36.0)
MCV: 103.1 fL — AB (ref 78.0–100.0)
Platelets: 315 10*3/uL (ref 150–400)
RBC: 2.94 MIL/uL — AB (ref 4.22–5.81)
RDW: 16.7 % — ABNORMAL HIGH (ref 11.5–15.5)
WBC: 10.4 10*3/uL (ref 4.0–10.5)

## 2013-08-27 LAB — CLOSTRIDIUM DIFFICILE BY PCR: CDIFFPCR: NEGATIVE

## 2013-08-27 MED ORDER — FREE WATER
300.0000 mL | Status: DC
  Administered 2013-08-27 – 2013-08-28 (×6): 300 mL

## 2013-08-27 MED ORDER — IPRATROPIUM-ALBUTEROL 0.5-2.5 (3) MG/3ML IN SOLN
3.0000 mL | Freq: Four times a day (QID) | RESPIRATORY_TRACT | Status: DC
  Administered 2013-08-27 – 2013-09-03 (×27): 3 mL via RESPIRATORY_TRACT
  Filled 2013-08-27 (×27): qty 3

## 2013-08-27 MED ORDER — ALBUTEROL SULFATE (2.5 MG/3ML) 0.083% IN NEBU: 2.5000 mg | INHALATION_SOLUTION | RESPIRATORY_TRACT | Status: DC | PRN

## 2013-08-27 MED ORDER — SODIUM CHLORIDE 0.9 % IV BOLUS (SEPSIS): 500.0000 mL | Freq: Once | INTRAVENOUS | Status: DC

## 2013-08-27 MED ORDER — FOLIC ACID 1 MG PO TABS
1.0000 mg | ORAL_TABLET | Freq: Every day | ORAL | Status: DC
  Administered 2013-08-27 – 2013-08-28 (×2): 1 mg
  Filled 2013-08-27 (×2): qty 1

## 2013-08-27 NOTE — Progress Notes (Signed)
PT Cancellation Note  Patient Details Name: [name] [name] MRN: [id number] DOB: November 17, 1940   Cancelled Treatment:    Reason Eval/Treat Not Completed: Medical issues which prohibited therapy (Weaning with RR incr per nursing/NT in bathing pt present)   [name],[name] Wojnar 08/27/2013, 10:08 AM Audree Camelawn [name],PT Acute Rehabilitation [phone] (pager)

## 2013-08-27 NOTE — Progress Notes (Signed)
PULMONARY / CRITICAL CARE MEDICINE   Name: [name] [name] MRN: [id number] DOB: 08-Dec-1940    ADMISSION DATE:  08/17/2013  PRIMARY SERVICE: PCCM  CHIEF COMPLAINT:  Respiratory distress.   BRIEF PATIENT DESCRIPTION:  73 y/o with DM, HTN and COPD. Presented from [facility] 6/27 with acute hypoxemic respiratory failure, secondary to ARDS in setting of septic shock.  SIGNIFICANT EVENTS / STUDIES:  6/27 - Admit w resp fx, cxr w bilateral patchy infiltrates.  Intubated & tx to [facility] 6/28 - ECHO (LVEF 60-65%, Grade 1 Diastolic dysfunction) 6/29 - paralyzed, peep 12, pressors required 6/30 - s/p bronch - suctioned complete obstruction b/l, NO HSV 7/1 - CXR (unchanged diffuse b/l edema / infiltrate) 7/2 - neg 4.6 liters 7/3 - hypoglycemia event (cbg 34), alert, on fentanyl only 7/4 - weaned most of day, marginal mental status 7/5 - weaning PSV (low volumes, mild tachypnea) 7/6 - continue wean with poor progress, CXR significantly improved aeration, b/l lower lobe atelectasis 7/7 - continue wean, improved progress, anticipate trial extubation on 7/8  LINES / TUBES: 6/27 ETT >>  6/27 L [place] CVC >>  6/28 R rad a line>>>7/2 Rectal Tube 7/6 >>  CULTURES: Bood cultures 6/27 >>> Negative Tracheal aspirate 6/27 >>> negative Urine culture 6/27 >>> Negative Resp virus PCR 6/28 >>> Negative Urine leg 6/28 >>> Negative Urine strep 6/28 >>> Negative Bronch 6/30 >>> oropharyngeal flora, HSV neg C.Diff 7/6 >> Negative  ANTIBIOTICS: - Completed levaquin for COPD exacerbation PTA.  Zosyn 6/27>>>6/29 Imipenem 6/29>>>7/1 Vancomycin 6/27>>> 6/30 Rocephin 7/1 >>> (set to DC on 7/8)  SUBJECTIVE:  Per RN yesterday with large "blow-out" soft BM, rectal tube placed. Today Stable mental status, low dose Fentanyl sedation. Remains on vent. Comfortable but not tolerating wean as anticipated.  VITAL SIGNS: Temp:  [98.3 F (36.8 C)-99.3 F (37.4 C)] 98.8 F (37.1 C) (07/07 0430) Pulse  Rate:  [81-112] 89 (07/07 0713) Resp:  [12-40] 15 (07/07 0713) BP: (68-124)/(35-83) 111/59 mmHg (07/07 0713) SpO2:  [96 %-100 %] 100 % (07/07 0713) FiO2 (%):  [30 %] 30 % (07/07 0713) Weight:  [150 lb 2.1 oz (68.1 kg)] 150 lb 2.1 oz (68.1 kg) (07/07 0400) HEMODYNAMICS: CVP:  [7 mmHg] 7 mmHg VENTILATOR SETTINGS: Vent Mode:  [-] PSV;CPAP FiO2 (%):  [30 %] 30 % Set Rate:  [12 bmp] 12 bmp Vt Set:  [440 mL] 440 mL PEEP:  [5 cmH20] 5 cmH20 Pressure Support:  [10 cmH20] 10 cmH20 Plateau Pressure:  [15 cmH20-21 cmH20] 15 cmH20 INTAKE / OUTPUT: Intake/Output     07/06 0701 - 07/07 0700 07/07 0701 - 07/08 0700   I.V. (mL/kg) 392.5 (5.8)    NG/GT 2370    IV Piggyback     Total Intake(mL/kg) 2762.5 (40.6)    Urine (mL/kg/hr) 2090 (1.3)    Stool 725 (0.4)    Total Output 2815     Net -52.5          Urine Occurrence 2 x    Stool Occurrence 3 x      PHYSICAL EXAMINATION: General: Comfortable appearing, NAD on vent Neuro: follows commands, grossly non-focal, rass 0 HEENT: OETT PULM: stable improved breath sounds, improved bibasilar coarse rhonchi, no resp distress, tachypnea with wean CV: RRR, no murmurs GI: soft , non-tender, non-distended, +active BS Extremities: no edema, non-tender  LABS: I have reviewed all of today's lab results. Relevant abnormalities are discussed in the A/P section  ASSESSMENT / PLAN:  PULMONARY A: Acute hypoxemic and  hypercarbic respiratory failure - remains on vent ARDS, bilateral patchy infiltrates - Improved COPD P:   - continue to wean vent, improved toleration on PSV wean - Anticipate trial extubation on 7/8, recommend short-term trach if needed - Repeat CXR in AM - Duonebs q 6 hr scheduled, Albuterol nebs q 3 hr PRN - PT to mobilize, out of bed  CARDIOVASCULAR A: Septic Shock / Hypotension - Resolved Diastolic CHF Hypertension P:  -Trend CVP -goal neg balance, 2L out in 24 hrs -continue norvasc 5mg  PO  RENAL A:   Creatinine -  Stable Hypernatremia - improved, 148 Metabolic Alkalosis Hypokalemia - Resolved Hyponatremia - Resolved P:   - Continue correct metabolic alkalosis, correct K, 40 mEq PO BID x 2 more doses - Free water to 300 ml Q4 - BMET in AM  GASTROINTESTINAL A:   N/V - Resolved Loose Stool - s/p rectal tube P:  - C.Diff negative - Protonix PO for GI prophylaxis - Continue TF with Oxepa (for ARDS) 6/29 >> - Hold Colace  HEMATOLOGIC A:   Leukocytosis - Resolved (peak 22.2 on 6/29) Anemia - stable Hgb, elevated MCV >100 P:  - SQ heparin - start Folate supplement  INFECTIOUS A:   Aspiration pneumonia vs HCAP - Improved, afebrile S/p Bronch 6/30 - culture & viral studies negative P:   - Continue Ceftriaxone (7/1 >> 7/8)  ENDOCRINE A:   DM - Stable Hypoglycemia P:   -SSI  NEUROLOGIC A:   Acute Encephalopathy Delirium  P:   -RASS goal: 0 -Fentanyl drip -WUA -Risperdal 0.5mg  BID -PRN haldol   GLOBAL: -Full code.   Continue vent wean today, seems to be tolerating wean better, remains clinically improved ARDS, complete CTX course on 7/8. Anticipate trial extubation tomorrow 7/8, if needed would recommend shot-term trach.  Saralyn PilarAlexander Karamalegos, DO [facility], PGY-2   08/27/2013 7:47 AM   PCCM ATTENDING: I have interviewed and examined the patient and reviewed the database. I have formulated the assessment and plan as reflected in the note above with amendments made by me. 35 mins of direct critical care time provided  Billy Fischeravid Sherell Christoffel, MD;  PCCM service; Mobile [phone]

## 2013-08-27 NOTE — Progress Notes (Signed)
DR. Marchelle Gearingamaswamy was notified of low BP in 70's-80's.   While on the phone, BP up without treatment . Will monitor.

## 2013-08-28 ENCOUNTER — Inpatient Hospital Stay (HOSPITAL_COMMUNITY): Payer: Medicare Other

## 2013-08-28 LAB — GLUCOSE, CAPILLARY
GLUCOSE-CAPILLARY: 134 mg/dL — AB (ref 70–99)
GLUCOSE-CAPILLARY: 168 mg/dL — AB (ref 70–99)
GLUCOSE-CAPILLARY: 250 mg/dL — AB (ref 70–99)
Glucose-Capillary: 183 mg/dL — ABNORMAL HIGH (ref 70–99)
Glucose-Capillary: 140 mg/dL — ABNORMAL HIGH (ref 70–99)
Glucose-Capillary: 180 mg/dL — ABNORMAL HIGH (ref 70–99)

## 2013-08-28 LAB — BASIC METABOLIC PANEL
Anion gap: 8 (ref 5–15)
BUN: 33 mg/dL — AB (ref 6–23)
CO2: 36 meq/L — AB (ref 19–32)
CREATININE: 0.68 mg/dL (ref 0.50–1.35)
Calcium: 8.5 mg/dL (ref 8.4–10.5)
Chloride: 99 mEq/L (ref 96–112)
GFR calc non Af Amer: >90 mL/min (ref >90)
GLUCOSE: 192 mg/dL — AB (ref 70–99)
POTASSIUM: 4.3 meq/L (ref 3.7–5.3)
Sodium: 143 mEq/L (ref 137–147)

## 2013-08-28 LAB — CBC
HEMATOCRIT: 29.2 % — AB (ref 39.0–52.0)
HEMOGLOBIN: 9.1 g/dL — AB (ref 13.0–17.0)
MCH: 32.3 pg (ref 26.0–34.0)
MCHC: 31.2 g/dL (ref 30.0–36.0)
MCV: 103.5 fL — ABNORMAL HIGH (ref 78.0–100.0)
Platelets: 339 10*3/uL (ref 150–400)
RBC: 2.82 MIL/uL — AB (ref 4.22–5.81)
RDW: 16.5 % — ABNORMAL HIGH (ref 11.5–15.5)
WBC: 10.5 10*3/uL (ref 4.0–10.5)

## 2013-08-28 MED ORDER — FOLIC ACID 5 MG/ML IJ SOLN
1.0000 mg | Freq: Every day | INTRAMUSCULAR | Status: DC
  Administered 2013-08-29 – 2013-08-31 (×3): 1 mg via INTRAVENOUS
  Filled 2013-08-28 (×4): qty 0.2

## 2013-08-28 MED ORDER — KCL IN DEXTROSE-NACL 20-5-0.45 MEQ/L-%-% IV SOLN
INTRAVENOUS | Status: DC
  Administered 2013-08-28 – 2013-09-02 (×6): via INTRAVENOUS
  Filled 2013-08-28 (×16): qty 1000

## 2013-08-28 MED ORDER — FENTANYL CITRATE 0.05 MG/ML IJ SOLN
12.5000 ug | INTRAMUSCULAR | Status: DC | PRN
  Administered 2013-08-29 – 2013-09-05 (×6): 25 ug via INTRAVENOUS
  Filled 2013-08-28 (×6): qty 2

## 2013-08-28 MED ORDER — ENOXAPARIN SODIUM 40 MG/0.4ML ~~LOC~~ SOLN
40.0000 mg | SUBCUTANEOUS | Status: DC
  Administered 2013-08-28 – 2013-09-06 (×10): 40 mg via SUBCUTANEOUS
  Filled 2013-08-28 (×10): qty 0.4

## 2013-08-28 NOTE — Progress Notes (Signed)
Wasted 220 ml of fentanyl in sink. Burnard BuntingKatrice Keller witnessed waste.

## 2013-08-28 NOTE — Progress Notes (Signed)
PULMONARY / CRITICAL CARE MEDICINE   Name: [name] [name] MRN: [id number] DOB: April 08, 1940    ADMISSION DATE:  08/17/2013  PRIMARY SERVICE: PCCM  CHIEF COMPLAINT:  Respiratory distress.   BRIEF PATIENT DESCRIPTION:  73 y/o with DM, HTN and COPD. Presented from [facility] 6/27 with acute hypoxemic respiratory failure, secondary to ARDS in setting of septic shock.  SIGNIFICANT EVENTS / STUDIES:  6/27 - Admit w resp fx, cxr w bilateral patchy infiltrates.  [facility] & [facility] - ECHO (LVEF 60-65%, Grade 1 Diastolic dysfunction) 6/29 - paralyzed, peep 12, pressors required 6/30 - FOB: suctioned complete obstruction  7/01 - CXR unchanged diffuse b/l edema / infiltrate 7/02 - neg 4.6 liters 70/3 - hypoglycemia event (cbg 34), alert, on fentanyl only 7/04 - weaned most of day, marginal mental status 7/05 - weaning PSV (low volumes, mild tachypnea) 7/06 - continue wean with poor progress, CXR significantly improved aeration, b/l lower lobe atelectasis 7/07 - continue wean, improved progress, anticipate trial extubation on 7/8 7/08 - trial extubation, CXR mostly stable with inc LLL opacity  LINES / TUBES: 6/27 ETT >> 7/8 6/28 R rad a line>>>7/2  6/27 L [place] CVC >>    CULTURES: Bood cultures 6/27 >>> Negative Tracheal aspirate 6/27 >>> negative Urine culture 6/27 >>> Negative Resp virus PCR 6/28 >>> Negative Urine leg 6/28 >>> Negative Urine strep 6/28 >>> Negative Bronch 6/30 >>> oropharyngeal flora, HSV neg C.Diff 7/6 >> Negative  ANTIBIOTICS: - Completed levaquin for COPD exacerbation PTA.  Zosyn 6/27>>>6/29 Imipenem 6/29>>>7/1 Vancomycin 6/27>>> 6/30 Rocephin 7/1 >>> 7/8  SUBJECTIVE:   Marginal on SBT. Trial extubation. Looks fairly good post extubation. Tachypneic but denies dyspnea. Diffusely weak. + F/C.   VITAL SIGNS: Temp:  [97.5 F (36.4 C)-99.2 F (37.3 C)] 98 F (36.7 C) (07/08 0858) Pulse Rate:  [80-103] 99 (07/08 0751) Resp:  [12-32] 31  (07/08 0751) BP: (75-146)/(27-82) 123/45 mmHg (07/08 0751) SpO2:  [96 %-100 %] 100 % (07/08 0751) FiO2 (%):  [30 %] 30 % (07/08 0751) Weight:  [145 lb 1 oz (65.8 kg)] 145 lb 1 oz (65.8 kg) (07/08 0445) HEMODYNAMICS:   VENTILATOR SETTINGS: Vent Mode:  [-] CPAP;PSV FiO2 (%):  [30 %] 30 % Set Rate:  [12 bmp] 12 bmp Vt Set:  [440 mL] 440 mL PEEP:  [5 cmH20-14 cmH20] 5 cmH20 Pressure Support:  [8 cmH20-14 cmH20] 8 cmH20 Plateau Pressure:  [10 cmH20-19 cmH20] 19 cmH20 INTAKE / OUTPUT: Intake/Output     07/07 0701 - 07/08 0700 07/08 0701 - 07/09 0700   I.V. (mL/kg) 367.5 (5.6)    Other 100    NG/GT 1860    IV Piggyback 50    Total Intake(mL/kg) 2377.5 (36.1)    Urine (mL/kg/hr) 1575 (1)    Stool 370 (0.2)    Total Output 1945     Net +432.5          Urine Occurrence 1 x    Stool Occurrence 1 x      PHYSICAL EXAMINATION: General: NAD Neuro: RASS 0, + F/C, diffusely weak HEENT: OETT PULM: slight increased coarse breath sounds L>R CV: RRR, no murmurs GI: soft , non-tender, non-distended, +active BS Extremities: no edema, non-tender  LABS: I have reviewed all of today's lab results. Relevant abnormalities are discussed in the A/P section  CXR: Increased opacity in LLL  ASSESSMENT / PLAN:  PULMONARY A: Acute hypoxemic and hypercarbic respiratory failure ARDS, bilateral patchy infiltrates, imrpvoing Increased LLL infiltrate -  infiltrae vs ATX COPD P:   Trial extubation Supp O2 Cont BDs If re-intubated, trach tube placement - discussed with wife  CARDIOVASCULAR A: Septic Shock / Hypotension, resolved Diastolic CHF Hypertension, controlled P:  Cont amlodipine Monitor  RENAL A:   Hypernatremia, improving Metabolic Alkalosis, improving Hypokalemia, resolved Hyponatremia, resolved P:   Monitor BMET intermittently Monitor I/Os Correct electrolytes as indicated Maintenance IVFs until taking POs  GASTROINTESTINAL A:   N/V, resolved Diarrhea, C diff  neg P:  SUP: N/I post extubation NPO until cognition and resp status permit SLP eval  HEMATOLOGIC A:   Anemia, macrocytic P:  DVT px: LMWH Monitor CBC intermittently Transfuse per usual ICU guidelines  INFECTIOUS A:   HCAP P:   Micro and abx as above  ENDOCRINE A:   DM 2 Episodic hypoglycemia P:   Cont SSI  NEUROLOGIC A:   Acute Encephalopathy, improved to resolved ICU associated discomfort Profound deconditioning P:   DC sedation protocol post extubation PRN fentanyl PT eval/Rx       Saralyn PilarAlexander Karamalegos, DO [facility], PGY-2    PCCM ATTENDING: I have interviewed and examined the patient and reviewed the database. I have formulated the assessment and plan as reflected in the note above with amendments made by me. 40 mins of direct critical care time provided. Wife updated @ bedside. Monitor closely post extubation. If re-intubated, it would be appropriate to proceed with trach tube placement  Billy Fischeravid Aristeo Hankerson, MD;  PCCM service; Mobile [phone]  08/28/2013 9:18 AM

## 2013-08-28 NOTE — Evaluation (Signed)
Physical Therapy Evaluation Patient Details Name: [name] [name] MRN: [id number] DOB: [birth year] Today's Date: 08/28/2013   History of Present Illness  Pt admit with VDRF with COPD exacerbation.  REcent admit for COPD exacerbation.   Clinical Impression  Pt admitted with above. Pt currently with functional limitations due to the deficits listed below (see PT Problem List). If wife can provide 24 hour care, pt is a good rehab candidate.   Pt will benefit from skilled PT to increase their independence and safety with mobility to allow discharge to the venue listed below.     Follow Up Recommendations CIR;Supervision/Assistance - 24 hour    Equipment Recommendations  None recommended by PT    Recommendations for Other Services Rehab consult     Precautions / Restrictions Precautions Precautions: Fall Restrictions Weight Bearing Restrictions: No      Mobility  Bed Mobility Overal bed mobility: Needs Assistance;+2 for physical assistance Bed Mobility: Supine to Sit     Supine to sit: Min assist;+2 for physical assistance     General bed mobility comments: Needed assist for LEs and elevation of trunk.  Transfers Overall transfer level: Needs assistance Equipment used: None Transfers: Sit to/from [employer] CorporationStand;Stand Pivot Transfers Sit to Stand: Min assist;+2 safety/equipment Stand pivot transfers: Min assist;+2 safety/equipment       General transfer comment: Pt wet upon sitting up with condom cath off.  Assisted nursing with cleaning with pt able to stand with min assist while nurse cleaned him.  he then took pivotal steps to chair from bed.   Ambulation/Gait                Stairs            Wheelchair Mobility    Modified Rankin (Stroke Patients Only)       Balance Overall balance assessment: Needs assistance;History of Falls Sitting-balance support: Bilateral upper extremity supported;Feet supported Sitting balance-Leahy Scale: Poor Sitting balance -  Comments: Leaning posteriorly and needed min assist to sit for 8 min while PT cleaned pts front and changed linens.   Postural control: Posterior lean Standing balance support: Bilateral upper extremity supported;During functional activity Standing balance-Leahy Scale: Poor Standing balance comment: Pt stood with bil UE support with min assist.                              Pertinent Vitals/Pain VSS, no pain    Home Living Family/patient expects to be discharged to:: Private residence Living Arrangements: Spouse/significant other Available Help at Discharge: Family;Available 24 hours/day             Additional Comments: No family present and pt questionable historian.      Prior Function           Comments: Unable to assess as pt stated he was independent and then that his wife helped him.      Hand Dominance        Extremity/Trunk Assessment   Upper Extremity Assessment: Defer to OT evaluation           Lower Extremity Assessment: Generalized weakness         Communication   Communication: Expressive difficulties  Cognition Arousal/Alertness: Awake/alert Behavior During Therapy: WFL for tasks assessed/performed Overall Cognitive Status: Impaired/Different from baseline Area of Impairment: Attention;Following commands;Safety/judgement;Awareness;Problem solving   Current Attention Level: Focused   Following Commands: Follows one step commands with increased time Safety/Judgement: Decreased awareness of deficits;Decreased awareness of safety  Problem Solving: Difficulty sequencing;Requires verbal cues      General Comments      Exercises General Exercises - Lower Extremity Ankle Circles/Pumps: AROM;Both;10 reps;Supine Long Arc Quad: AROM;Both;10 reps;Seated      Assessment/Plan    PT Assessment Patient needs continued PT services  PT Diagnosis Generalized weakness   PT Problem List Decreased strength;Decreased activity  tolerance;Decreased balance;Decreased mobility;Decreased knowledge of use of DME;Decreased safety awareness;Decreased knowledge of precautions  PT Treatment Interventions DME instruction;Gait training;Functional mobility training;Therapeutic activities;Therapeutic exercise;Balance training;Patient/family education   PT Goals (Current goals can be found in the Care Plan section) Acute Rehab PT Goals Patient Stated Goal: to go home PT Goal Formulation: With patient Time For Goal Achievement: 09/11/13 Potential to Achieve Goals: Good    Frequency Min 3X/week   Barriers to discharge        Co-evaluation               End of Session Equipment Utilized During Treatment: Gait belt;Oxygen Activity Tolerance: Patient limited by fatigue Patient left: in chair;with call bell/phone within reach;with nursing/sitter in room Nurse Communication: Mobility status         Time: 1208-1230 PT Time Calculation (min): 22 min   Charges:   PT Evaluation $Initial PT Evaluation Tier I: 1 Procedure PT Treatments $Therapeutic Activity: 8-22 mins   PT G Codes:          INGOLD,Omarie Parcell 08/28/2013, 1:42 PM [facility],PT Acute Rehabilitation [phone] (pager)

## 2013-08-28 NOTE — Procedures (Signed)
Extubation Procedure Note  Patient Details:   Name: Vaughan BastaJasper Meeuwsen DOB: [birth year] MRN: [id number]   Airway Documentation:     Evaluation  O2 sats: stable throughout Complications: No apparent complications Patient did tolerate procedure well. Bilateral Breath Sounds: Rhonchi Suctioning: Airway Yes Patient extubated to 4lnc. RN at bedside. No complications. Vital signs stable at this time. RT will continue to monitor.  Ave Filterdkins, Kathi Dohn Williams 08/28/2013, 11:38 AM

## 2013-08-28 NOTE — Progress Notes (Signed)
NUTRITION FOLLOW UP  Intervention:    Diet advancement as tolerated post extubation, consider swallow evaluation to determine safest diet consistency.  Nutrition Dx:   Inadequate oral intake related to inability to eat as evidenced by NPO status, ongoing.  Goal:   Intake to meet >90% of estimated nutrition needs. Unmet.  Monitor:   Respiratory status, diet advancement, PO intake, labs, weight trend.  Assessment:   73 year old male with DM, HTN and COPD. Presented to [facility] 6/27 with acute hypoxemic respiratory failure. Being managed in ICU for septic shock, ARDS.  Patient is currently intubated on ventilator support MV: 7.4 L/min Temp (24hrs), Avg:98.5 F (36.9 C), Min:97.5 F (36.4 C), Max:99.2 F (37.3 C)  TF is currently off since this morning for a trial extubation today. Patient has been tolerating TF well with no residuals (Oxepa at 40 ml/h with Prostat 30 ml QID provided 1840 kcals, 120 gm protein, 754 ml free water daily.  Height: Ht Readings from Last 1 Encounters:  08/17/13 5\' 10"  (1.778 m)    Weight Status:   Wt Readings from Last 1 Encounters:  08/28/13 145 lb 1 oz (65.8 kg)  08/22/13  165 lb 5.5 oz (75 kg)  08/19/13  172 lb 9.9 oz (78.3 kg)   Re-estimated needs (after extubation):  Kcal: 1700-1900 Protein: 85-100 gm  Fluid: 2 L  Skin: 2 stage 2 pressure ulcers to back  Diet Order: NPO   Intake/Output Summary (Last 24 hours) at 08/28/13 1036 Last data filed at 08/28/13 1000  Gross per 24 hour  Intake 1937.5 ml  Output   1730 ml  Net  207.5 ml    Last BM: 7/8   Labs:   Recent Labs Lab 08/24/13 0500 08/25/13 0400 08/26/13 0400 08/27/13 0500 08/28/13 0430  NA 149* 150* 151* 148* 143  K 3.5* 3.7 3.7 4.1 4.3  CL 98 96 97 98 99  CO2 >45* >45* >45* 41* 36*  BUN 31* 37* 46* 43* 33*  CREATININE 0.67 0.76 0.81 0.79 0.68  CALCIUM 8.6 8.9 9.1 8.6 8.5  MG 2.0 2.1  --   --   --   GLUCOSE 199* 172* 171* 166* 192*     CBG (last 3)   Recent Labs  08/27/13 2344 08/28/13 0421 08/28/13 0754  GLUCAP 250* 183* 140*    Scheduled Meds: . antiseptic oral rinse  15 mL Mouth Rinse QID  . chlorhexidine  15 mL Mouth Rinse BID  . enoxaparin (LOVENOX) injection  40 mg Subcutaneous Q24H  . [START ON 08/29/2013] folic acid  1 mg Intravenous Daily  . insulin aspart  0-15 Units Subcutaneous 6 times per day  . ipratropium-albuterol  3 mL Nebulization Q6H    Continuous Infusions: . sodium chloride 10 mL/hr at 08/27/13 0010  . dextrose 5 % and 0.45 % NaCl with KCl 20 mEq/L      Joaquin CourtsKimberly Camisha Srey, RD, LDN, CNSC Pager [phone] After Hours Pager [phone]

## 2013-08-28 NOTE — Care Management Note (Unsigned)
    Page 1 of 2   09/05/2013     2:46:39 PM CARE MANAGEMENT NOTE 09/05/2013  Patient:  [name] [name],[name]   Account Number:  [id number]  Date Initiated:  08/19/2013  Documentation initiated by:  Avie ArenasBROWN,[name]  Subjective/Objective Assessment:   Admitted with resp failure - intubated.     Action/Plan:   Anticipated DC Date:  09/04/2013   Anticipated DC Plan:  IP REHAB FACILITY      DC Planning Services  CM consult      Choice offered to / List presented to:             Status of service:  In process, will continue to follow Medicare Important Message given?  YES (If response is "NO", the following Medicare IM given date fields will be blank) Date Medicare IM given:  08/28/2013 Medicare IM given [facility]:  [name] Date Additional Medicare IM given:  09/05/2013 Additional Medicare IM given by:  Mykela Mewborn [name]  Discharge Disposition:    Per UR Regulation:  Reviewed for med. necessity/level of care/duration of stay  If discussed at Long Length of Stay Meetings, dates discussed:   08/22/2013  09/03/2013  09/05/2013    Comments:  Contact:  [name] [name],[name] [name] [phone]                 [place],[name] [name] [phone]                  [name] [name],[name] [name] [phone] 09-05-13 [address] [name] Fredin Graves-Bigelow, [name],BSN [phone] CM will f/u after Palliative Meeting.  09-04-13 [address]1526 [name] [name], [place] [phone] Pt continues on IV soulmedrol and duonebs. Bipap prn day and qhs schedule. Palliative Care to f/u 09-05-13 @ 2pm family meeting. CM will continue to monitor for disposition needs.  09/02/13- 1125- [name] [name], BSN [phone] Pt placed back on BIPAP over weekend for resp. distress- pt remains on bipap this AM- family considering moving to comfort care only- pt already a DNR- CIR following, CSW also following for potential placement needs- NCM to follow for potential needs.  08/29/13- 1500- [name] [name], BSN [phone] Transferred to SDU today- UR completed-  CIR following for possible admission  08-28-13 2:20pm Avie ArenasSarah [name], RNBSN - [phone] Extubated this am - dc'd IV antibiotics.  PT assisted with getting out of bed to chair and recommending CIR.  CIR consult placed.  08-26-13 2:25pm Avie ArenasSarah [name], RNBSN [phone] Remains on vent and fentanyl.   Hope to extubate soon - May need ST trach - would be good Ltach candidate.  08-21-13 6:40am Johny ShearsSarah [name],RNBSN - [phone] Remains on vent

## 2013-08-28 NOTE — Progress Notes (Signed)
Rehab Admissions Coordinator Note:  Patient was screened by Trish MageLogue, Ardena Gangl M for appropriateness for an Inpatient Acute Rehab Consult.  At this time, an inpatient rehab consult has already been ordered and is pending.  Lelon FrohlichLogue, Graham Doukas M 08/28/2013, 2:42 PM  I can be reached at [phone].

## 2013-08-28 NOTE — Consult Note (Signed)
Physical Medicine and Rehabilitation Consult  Reason for Consult: Deconditioning Referring Physician: Dr. Sung Amabile   HPI: [name] [name] is a 73 y.o. male with history of HTN, DM type 2, progressive dementia past few months, COPD with recent exacerbation ICU stay at [facility] who was discharged to home three weeks ago but continued to have SOB with progressive weakness. He was admitted on 08/17/13 with hypoxemic respiratory failure with decreased LOC requiring intubation. Patient with ARDS due to septic shock and treated with IV antibiotics for pneumonia as well as pressors due to hypotension. CT head without acute abnormality.  Patient with delirium with severe agitation. Started on vent wean with improvement in mental status and extubated to oxygen per [place] on 08/28/13 and PT evaluation done. Patient continues to be confused with fall early this am. He is noted to be deconditioned. MD, PT recommending CIR.    Review of Systems  Unable to perform ROS: mental acuity    Past Medical History  Diagnosis Date  . Hypertension   . Diabetes mellitus without complication   . COPD (chronic obstructive pulmonary disease)   . Dementia     per family reports mild forgetfulness   History reviewed. No pertinent past surgical history.  No family history on file.  Social History:  Married. Lives with wife and independent PTA. Was starting to wander per son. Per reports that he has quit smoking years ago but worked with [employer], [employer]. He does not have any smokeless tobacco history on file. His alcohol and drug histories are not on file.   Allergies  Allergen Reactions  . Lisinopril Other (See Comments)    Unknown reaction    Medications Prior to Admission  Medication Sig Dispense Refill  . albuterol (PROVENTIL HFA;VENTOLIN HFA) 108 (90 BASE) MCG/ACT inhaler Inhale 2 puffs into the lungs every 4 (four) hours as needed for wheezing or shortness of breath.      Marland Kitchen amLODipine (NORVASC) 5 MG  tablet Take 5 mg by mouth daily.      Marland Kitchen aspirin 325 MG tablet Take 325 mg by mouth every other day.       . budesonide-formoterol (SYMBICORT) 160-4.5 MCG/ACT inhaler Inhale 2 puffs into the lungs 2 (two) times daily.      . cholecalciferol (VITAMIN D) 1000 UNITS tablet Take 1,000 Units by mouth daily.      Marland Kitchen docusate sodium (COLACE) 100 MG capsule Take 100 mg by mouth 2 (two) times daily.      . Emollient (HYDROPHOR EX) Apply 1 application topically 3 (three) times daily as needed (itching).      . insulin detemir (LEVEMIR) 100 UNIT/ML injection Inject 10 Units into the skin at bedtime.      . metFORMIN (GLUCOPHAGE) 500 MG tablet Take 500 mg by mouth 2 (two) times daily with a meal.      . solifenacin (VESICARE) 10 MG tablet Take 10 mg by mouth daily.       Marland Kitchen levofloxacin (LEVAQUIN) 500 MG tablet Take 500 mg by mouth daily. 3 day course completed 08/16/13        Home: Home Living Family/patient expects to be discharged to:: Private residence Living Arrangements: Spouse/significant other Available Help at Discharge: Family;Available 24 hours/day Additional Comments: No family present and pt questionable historian.    Functional History: Prior Function Comments: Unable to assess as pt stated he was independent and then that his wife helped him.  Functional Status:  Mobility: Bed Mobility Overal bed mobility:  Needs Assistance;+2 for physical assistance Bed Mobility: Supine to Sit Supine to sit: Min assist;+2 for physical assistance General bed mobility comments: Needed assist for LEs and elevation of trunk. Transfers Overall transfer level: Needs assistance Equipment used: None Transfers: Sit to/from UGI CorporationStand;Stand Pivot Transfers Sit to Stand: Min assist;+2 safety/equipment Stand pivot transfers: Min assist;+2 safety/equipment General transfer comment: Pt wet upon sitting up with condom cath off.  Assisted nursing with cleaning with pt able to stand with min assist while nurse cleaned  him.  he then took pivotal steps to chair from bed.       ADL:    Cognition: Cognition Overall Cognitive Status: Impaired/Different from baseline Orientation Level: Oriented to person;Oriented to place;Disoriented to time;Disoriented to situation Cognition Arousal/Alertness: Awake/alert Behavior During Therapy: WFL for tasks assessed/performed Overall Cognitive Status: Impaired/Different from baseline Area of Impairment: Attention;Following commands;Safety/judgement;Awareness;Problem solving Current Attention Level: Focused Following Commands: Follows one step commands with increased time Safety/Judgement: Decreased awareness of deficits;Decreased awareness of safety Problem Solving: Difficulty sequencing;Requires verbal cues  Blood pressure 112/49, pulse 83, temperature 98 F (36.7 C), temperature source Oral, resp. rate 30, height 5\' 10"  (1.778 m), weight 65.8 kg (145 lb 1 oz), SpO2 100.00%. Physical Exam  Nursing note and vitals reviewed. Constitutional: He has a sickly appearance. Nasal cannula in place.  Frail appearing male   HENT:  Head: Normocephalic and atraumatic.  Eyes: Conjunctivae are normal. Pupils are equal, round, and reactive to light.  Neck: Normal range of motion.  Cardiovascular: Normal rate and regular rhythm.   Respiratory: Effort normal. No respiratory distress. He has wheezes in the right lower field. He has rhonchi.  GI: Soft. Bowel sounds are normal. He exhibits no distension. There is no tenderness.  Neurological: He is alert.  Speaks in a whisper. Oriented to self. Chose "hospital" amongst choices for "where you are." Confused speech and appears disoriented with flat affect. Slow movements.  Moves all 4's with grossly 3+ to 4/5 strength prox to distal. No gross sensory deficits.   Skin: Skin is warm and dry.  Psychiatric: His affect is blunt. His speech is delayed. He is slowed. He exhibits abnormal recent memory and abnormal remote memory.  Flat,  cooperative    Results for orders placed during the hospital encounter of 08/17/13 (from the past 24 hour(s))  GLUCOSE, CAPILLARY     Status: Abnormal   Collection Time    08/27/13  3:32 PM      Result Value Ref Range   Glucose-Capillary 176 (*) 70 - 99 mg/dL   Comment 1 Documented in Chart     Comment 2 Notify RN    GLUCOSE, CAPILLARY     Status: Abnormal   Collection Time    08/27/13  7:17 PM      Result Value Ref Range   Glucose-Capillary 118 (*) 70 - 99 mg/dL   Comment 1 Documented in Chart     Comment 2 Notify RN    GLUCOSE, CAPILLARY     Status: Abnormal   Collection Time    08/27/13 11:44 PM      Result Value Ref Range   Glucose-Capillary 250 (*) 70 - 99 mg/dL   Comment 1 Documented in Chart     Comment 2 Notify RN    GLUCOSE, CAPILLARY     Status: Abnormal   Collection Time    08/28/13  4:21 AM      Result Value Ref Range   Glucose-Capillary 183 (*) 70 - 99 mg/dL  Comment 1 Documented in Chart     Comment 2 Notify RN    BASIC METABOLIC PANEL     Status: Abnormal   Collection Time    08/28/13  4:30 AM      Result Value Ref Range   Sodium 143  137 - 147 mEq/L   Potassium 4.3  3.7 - 5.3 mEq/L   Chloride 99  96 - 112 mEq/L   CO2 36 (*) 19 - 32 mEq/L   Glucose, Bld 192 (*) 70 - 99 mg/dL   BUN 33 (*) 6 - 23 mg/dL   Creatinine, Ser 1.610.68  0.50 - 1.35 mg/dL   Calcium 8.5  8.4 - 09.610.5 mg/dL   GFR calc non Af Amer >90  >90 mL/min   GFR calc Af Amer >90  >90 mL/min   Anion gap 8  5 - 15  CBC     Status: Abnormal   Collection Time    08/28/13  4:30 AM      Result Value Ref Range   WBC 10.5  4.0 - 10.5 K/uL   RBC 2.82 (*) 4.22 - 5.81 MIL/uL   Hemoglobin 9.1 (*) 13.0 - 17.0 g/dL   HCT 04.529.2 (*) 40.939.0 - 81.152.0 %   MCV 103.5 (*) 78.0 - 100.0 fL   MCH 32.3  26.0 - 34.0 pg   MCHC 31.2  30.0 - 36.0 g/dL   RDW 91.416.5 (*) 78.211.5 - 95.615.5 %   Platelets 339  150 - 400 K/uL  GLUCOSE, CAPILLARY     Status: Abnormal   Collection Time    08/28/13  7:54 AM      Result Value Ref Range     Glucose-Capillary 140 (*) 70 - 99 mg/dL  GLUCOSE, CAPILLARY     Status: Abnormal   Collection Time    08/28/13 12:12 PM      Result Value Ref Range   Glucose-Capillary 180 (*) 70 - 99 mg/dL   Dg Chest Port 1 View  08/28/2013   CLINICAL DATA:  Respiratory failure, shortness of breath  EXAM: PORTABLE CHEST - 1 VIEW  COMPARISON:  Portable chest x-ray of 08/26/2013  FINDINGS: The tip of the endotracheal tube is approximately 2.7 cm above the carina. Opacity remains at the left lung base consistent with worsening atelectasis or possibly pneumonia and followup is recommended. Left central venous line tip overlies the mid SVC. Atelectasis at the right lung base has improved somewhat.  IMPRESSION: 1. Increasing opacity at the left lung base. Possible atelectasis but recommend followup to exclude pneumonia. 2. Improved aeration of the right lung base. 3. Tip of endotracheal tube 2.7 cm above the carina.   Electronically Signed   By: Dwyane DeePaul  Barry M.D.   On: 08/28/2013 08:02    Assessment/Plan: Diagnosis: deconditioning after sepsis, AVDRF 1. Does the need for close, 24 hr/day medical supervision in concert with the patient's rehab needs make it unreasonable for this patient to be served in a less intensive setting? Yes and Potentially 2. Co-Morbidities requiring supervision/potential complications: dementia, pneumonia 3. Due to bladder management, bowel management, safety, skin/wound care, disease management, medication administration and patient education, does the patient require 24 hr/day rehab nursing? Yes and Potentially 4. Does the patient require coordinated care of a physician, rehab nurse, PT (1-2 hrs/day, 5 days/week), OT (1-2 hrs/day, 5 days/week) and SLP (1-2 hrs/day, 5 days/week) to address physical and functional deficits in the context of the above medical diagnosis(es)? Yes and Potentially Addressing deficits in the following  areas: balance, endurance, locomotion, strength, transferring,  bowel/bladder control, bathing, dressing, feeding, grooming, toileting, cognition, speech, language, swallowing and psychosocial support 5. Can the patient actively participate in an intensive therapy program of at least 3 hrs of therapy per day at least 5 days per week? Yes 6. The potential for patient to make measurable gains while on inpatient rehab is good 7. Anticipated functional outcomes upon discharge from inpatient rehab are supervision  with PT, supervision with OT, supervision and min assist with SLP. 8. Estimated rehab length of stay to reach the above functional goals is: 7-10 days 9. Does the patient have adequate social supports to accommodate these discharge functional goals? Yes it appears 10. Anticipated D/C setting: Home 11. Anticipated post D/C treatments: HH therapy and Outpatient therapy 12. Overall Rehab/Functional Prognosis: excellent  RECOMMENDATIONS: This patient's condition is appropriate for continued rehabilitative care in the following setting: CIR Patient has agreed to participate in recommended program. Potentially and N/A Note that insurance prior authorization may be required for reimbursement for recommended care.  Comment: CIR--as long as wife/family can provide 24 hour supervision. Rehab Admissions Coordinator to follow up.  Thanks,  Ranelle Oyster, MD, [place] Dom   Thanks,  Ranelle Oyster, MD, [place] Dom     08/28/2013

## 2013-08-29 ENCOUNTER — Inpatient Hospital Stay (HOSPITAL_COMMUNITY): Payer: Medicare Other

## 2013-08-29 DIAGNOSIS — R5381 Other malaise: Secondary | ICD-10-CM

## 2013-08-29 DIAGNOSIS — R29898 Other symptoms and signs involving the musculoskeletal system: Secondary | ICD-10-CM | POA: Diagnosis present

## 2013-08-29 DIAGNOSIS — J9600 Acute respiratory failure, unspecified whether with hypoxia or hypercapnia: Secondary | ICD-10-CM

## 2013-08-29 LAB — BASIC METABOLIC PANEL
ANION GAP: 10 (ref 5–15)
BUN: 17 mg/dL (ref 6–23)
CHLORIDE: 98 meq/L (ref 96–112)
CO2: 32 meq/L (ref 19–32)
CREATININE: 0.53 mg/dL (ref 0.50–1.35)
Calcium: 8.9 mg/dL (ref 8.4–10.5)
GFR calc Af Amer: >90 mL/min (ref >90)
GFR calc non Af Amer: >90 mL/min (ref >90)
Glucose, Bld: 245 mg/dL — ABNORMAL HIGH (ref 70–99)
POTASSIUM: 4.5 meq/L (ref 3.7–5.3)
Sodium: 140 mEq/L (ref 137–147)

## 2013-08-29 LAB — CBC
HEMATOCRIT: 32.9 % — AB (ref 39.0–52.0)
HEMOGLOBIN: 10.2 g/dL — AB (ref 13.0–17.0)
MCH: 32 pg (ref 26.0–34.0)
MCHC: 31 g/dL (ref 30.0–36.0)
MCV: 103.1 fL — ABNORMAL HIGH (ref 78.0–100.0)
Platelets: 397 10*3/uL (ref 150–400)
RBC: 3.19 MIL/uL — AB (ref 4.22–5.81)
RDW: 15.6 % — ABNORMAL HIGH (ref 11.5–15.5)
WBC: 7.9 10*3/uL (ref 4.0–10.5)

## 2013-08-29 LAB — GLUCOSE, CAPILLARY
GLUCOSE-CAPILLARY: 110 mg/dL — AB (ref 70–99)
GLUCOSE-CAPILLARY: 141 mg/dL — AB (ref 70–99)
GLUCOSE-CAPILLARY: 224 mg/dL — AB (ref 70–99)
GLUCOSE-CAPILLARY: 94 mg/dL (ref 70–99)
Glucose-Capillary: 173 mg/dL — ABNORMAL HIGH (ref 70–99)
Glucose-Capillary: 180 mg/dL — ABNORMAL HIGH (ref 70–99)
Glucose-Capillary: 139 mg/dL — ABNORMAL HIGH (ref 70–99)

## 2013-08-29 LAB — CLOSTRIDIUM DIFFICILE BY PCR: Toxigenic C. Difficile by PCR: NEGATIVE

## 2013-08-29 NOTE — Progress Notes (Addendum)
I met with pt and his wife at bedside. Discussed the option of an inpt rehab admission prior to d/c home. They are in agreement. They do not want inpt rehab admission at [facility] even though they are from [facility]. Noted SLP recommends NPO, but wife states pt just ate 2 cups of ice this morning. Would recommend alternative means for feeds and meds. I will follow . Clarify when felt medically ready once means of nutrition and meds obtained. [phone]

## 2013-08-29 NOTE — Progress Notes (Signed)
PULMONARY / CRITICAL CARE MEDICINE   Name: [name] [name] MRN: [id number] DOB: 1940/11/24    ADMISSION DATE:  08/17/2013  PRIMARY SERVICE: PCCM  CHIEF COMPLAINT:  Respiratory distress.   BRIEF PATIENT DESCRIPTION:  73 y/o with DM, HTN and COPD. Presented from [facility] 6/27 with acute hypoxemic respiratory failure, secondary to ARDS in setting of septic shock.  SIGNIFICANT EVENTS / STUDIES:  6/27 - Admit w resp fx, cxr w bilateral patchy infiltrates.  Intubated & tx to [facility] 6/28 - ECHO (LVEF 60-65%, Grade 1 Diastolic dysfunction) 6/29 - paralyzed, peep 12, pressors required 6/30 - FOB: suctioned complete obstruction  7/01 - CXR unchanged diffuse b/l edema / infiltrate 7/02 - neg 4.6 liters 7/03 - hypoglycemia event (cbg 34), alert, on fentanyl only 7/04 - weaned most of day, marginal mental status 7/05 - weaning PSV (low volumes, mild tachypnea) 7/06 - continue wean with poor progress, CXR significantly improved aeration, b/l lower lobe atelectasis 7/07 - continue wean, improved progress, anticipate trial extubation on 7/8 7/08 - trial extubation, CXR mostly stable with inc LLL opacity 7/09 - transfer to SDU, s/p extubation 7/8, CXR stable, eval for CIR placement  LINES / TUBES: 6/27 ETT >> 7/08 6/28 R rad a line>>>7/02  6/27 L [place] CVC >> 7/09   CULTURES: Bood cultures 6/27 >>> Negative Tracheal aspirate 6/27 >>> negative Urine culture 6/27 >>> Negative Resp virus PCR 6/28 >>> Negative Urine leg 6/28 >>> Negative Urine strep 6/28 >>> Negative Bronch 6/30 >>> oropharyngeal flora, HSV neg C.Diff 7/6 >> Negative  ANTIBIOTICS: - Completed levaquin for COPD exacerbation PTA.  Zosyn 6/27>>>6/29 Imipenem 6/29>>>7/1 Vancomycin 6/27>>> 6/30 Rocephin 7/1 >>> 7/8  SUBJECTIVE:  Overnight event with fall OOB, found on floor at bedside with confusion, no injuries or complaints of pain. Today awake, O2 sat well on 2L [place], improved confusion / orientation. Admits  difficulty breathing with weak cough, but denies pain.   VITAL SIGNS: Temp:  [97.8 F (36.6 C)-98.5 F (36.9 C)] 97.8 F (36.6 C) (07/09 0439) Pulse Rate:  [71-107] 87 (07/09 0600) Resp:  [17-42] 27 (07/09 0600) BP: (103-181)/(43-81) 124/55 mmHg (07/09 0600) SpO2:  [90 %-100 %] 98 % (07/09 0600) FiO2 (%):  [30 %] 30 % (07/08 0800) Weight:  [141 lb 12.1 oz (64.3 kg)] 141 lb 12.1 oz (64.3 kg) (07/09 0411) HEMODYNAMICS:   VENTILATOR SETTINGS: Vent Mode:  [-] CPAP;PSV FiO2 (%):  [30 %] 30 % PEEP:  [5 cmH20] 5 cmH20 Pressure Support:  [8 cmH20] 8 cmH20 INTAKE / OUTPUT: Intake/Output     07/08 0701 - 07/09 0700 07/09 0701 - 07/10 0700   I.V. (mL/kg) 1530 (23.8)    Other     NG/GT 0    IV Piggyback     Total Intake(mL/kg) 1530 (23.8)    Urine (mL/kg/hr) 1850 (1.2)    Stool 100 (0.1)    Total Output 1950     Net -420          Urine Occurrence 2 x      PHYSICAL EXAMINATION: General: NAD Neuro: awake, alert, improved orientation (name, loc, year), follows commands, generalized symmetrical weakness PULM: mostly unchanged coarse breath sounds L>R with some diminished air movement Left lower CV: RRR, no murmurs GI: soft , non-tender, non-distended, +active BS Extremities: no edema, non-tender, intact distal pulses  LABS: I have reviewed all of today's lab results. Relevant abnormalities are discussed in the A/P section  CXR: Stable, LLL opacity  ASSESSMENT / PLAN:  PULMONARY A: Acute hypoxemic and hypercarbic respiratory failure - s/p extubation 7/8 ARDS, bilateral patchy infiltrates, improving Increased LLL infiltrate - infiltrate vs atelectasis COPD P:   - Transfer out to SDU - Cont supplemental O2 - Cont bronchodilators - If requires re-intubation, plan trach tube placement  CARDIOVASCULAR A: Septic Shock / Hypotension, resolved Diastolic CHF Hypertension, controlled P:  - Cont amlodipine - Monitor  RENAL A:   Hypernatremia, resolved Metabolic  Alkalosis, resolved Hypokalemia, resolved Hyponatremia, resolved P:   - Monitor BMET intermittently - Monitor I/Os - Correct electrolytes as indicated - Maintenance IVFs with D5 1/2NS @ 75cc/hr until taking POs  GASTROINTESTINAL A:   N/V, resolved Diarrhea, C diff neg Post extubation dysphagia - failed SLP swallow P:  SUP N/I post extubation NPO SLP following  HEMATOLOGIC A:   Anemia, macrocytic P:  - DVT px: LMWH - Monitor CBC intermittently - Transfuse per usual ICU guidelines Cont empiric folate  INFECTIOUS A:   HCAP, treated P:   - Micro and abx as above  ENDOCRINE A:   DM 2 Episodic hypoglycemia P:   - Cont CBGs/SSI  NEUROLOGIC A:   Acute Encephalopathy, improved to resolved ICU associated discomfort Profound deconditioning P:   - Fentanyl PRN - PT eval rec CIR, to be evaluated for placement  GLOBAL: Transfer out to SDU, stable resp status post extubation (7/8), CIR eval, NPO failed SLP cont follow.   [name] PilarAlexander Karamalegos, DO [facility], PGY-2   PCCM ATTENDING: I have interviewed and examined the patient and reviewed the database. I have formulated the assessment and plan as reflected in the note above with amendments made by me. Son updated @ bedside  He is improving but still has somewhat tenuous respiratory status. Therefore remains on PCCM service. Will eventually need CIR for reconditioning  [name] Fischeravid Hawkins Seaman, MD;  PCCM service; Mobile [phone]  08/29/2013 7:07 AM

## 2013-08-29 NOTE — Progress Notes (Signed)
Utilization review completed.  

## 2013-08-29 NOTE — Evaluation (Signed)
Clinical/Bedside Swallow Evaluation Patient Details  Name: [name] [name] MRN: [id number] Date of Birth: 1941/01/25  Today's Date: 08/29/2013 Time: [id number] SLP Time Calculation (min): 17 min  Past Medical History:  Past Medical History  Diagnosis Date  . Hypertension   . Diabetes mellitus without complication   . COPD (chronic obstructive pulmonary disease)   . Dementia     per family reports mild forgetfulness   Past Surgical History: History reviewed. No pertinent past surgical history. HPI:  73 y/o with DM, HTN and COPD. [facility] 6/[facility] with acute hypoxemic respiratory failure, secondary to ARDS in setting of septic shock. Intubated from 6/27 to 7/8. Son reports coughing with liquids and expectoration of mucous at baseline.   Assessment / Plan / Recommendation Clinical Impression  Pt demonstrates evidence of an acute reversible dysphagia following prolonged intubation, also with concern for a baseline dysphagia. Pt demonstrates decreased ability to protect his airway with hoarse vocal quality and weak cough despite max cueing. Pt with coughing after all PO trials, also wet vocal quality and decreased hyolaryngeal mobility. Pt should remain NPO today, SLP will f/u tomorrow for further PO trials.     Aspiration Risk  Severe    Diet Recommendation NPO   Medication Administration: Via alternative means    Other  Recommendations Oral Care Recommendations: Oral care Q4 per protocol   Follow Up Recommendations       Frequency and Duration min 2x/week  2 weeks   Pertinent Vitals/Pain NA    SLP Swallow Goals     Swallow Study Prior Functional Status       General HPI: 73 y/o with DM, HTN and COPD. Presented from [facility] 6/27 with acute hypoxemic respiratory failure, secondary to ARDS in setting of septic shock. Intubated from 6/27 to 7/8. Son reports coughing with liquids and expectoration of mucous at baseline. Type of  Study: Bedside swallow evaluation Previous Swallow Assessment: none Diet Prior to this Study: NPO Temperature Spikes Noted: No Respiratory Status: Nasal cannula History of Recent Intubation: Yes Length of Intubations (days): 12 days Date extubated: 08/28/13 Behavior/Cognition: Alert;Cooperative;Confused Oral Cavity - Dentition: Adequate natural dentition Self-Feeding Abilities: Needs assist Patient Positioning: Upright in bed Baseline Vocal Quality: Hoarse;Low vocal intensity Volitional Cough: Congested;Weak Volitional Swallow: Able to elicit    Oral/Motor/Sensory Function Overall Oral Motor/Sensory Function: Appears within functional limits for tasks assessed   Ice Chips Ice chips: Impaired Presentation: Spoon Pharyngeal Phase Impairments: Suspected delayed Swallow;Decreased hyoid-laryngeal movement;Throat Clearing - Immediate;Cough - Immediate   Thin Liquid Thin Liquid: Impaired Presentation: Cup Pharyngeal  Phase Impairments: Suspected delayed Swallow;Wet Vocal Quality;Throat Clearing - Immediate;Cough - Immediate;Decreased hyoid-laryngeal movement    Nectar Thick Nectar Thick Liquid: Not tested   Honey Thick Honey Thick Liquid: Not tested   Puree Puree: Impaired Presentation: Spoon Pharyngeal Phase Impairments: Suspected delayed Swallow;Wet Vocal Quality;Throat Clearing - Immediate;Cough - Immediate;Decreased hyoid-laryngeal movement   Solid   GO    Solid: Not tested      Harlon DittyBonnie Walterine Amodei, MA CCC-SLP [phone]  Claudine MoutonDeBlois, Beya Tipps Caroline 08/29/2013,9:07 AM

## 2013-08-29 NOTE — Progress Notes (Addendum)
Pt found on floor beside bed, bed alarm going off, side rails up, and stool on pt and floor. Pt had yellow slippers on and call light on bed. Confused prior to fall and post fall the same. Assessed skin and nothing new noted, denies tenderness, and pt denies pain. Post VS: 181/80-105-30-90% RA- CBG 224. Dr. Vassie LollAlva notified and new orders carried out. Pt given bath and currently in bed with side rails up, bed alarm on, yellow slippers on, call bell beside pt, and posey belt on. Will continue to monitor pt and will call family closer to morning.  Called pt's wife, Nicole CellaDorothy, and she is aware of fall.

## 2013-08-29 NOTE — Progress Notes (Signed)
eLink Physician-Brief Progress Note Patient Name: [name] [name] DOB: [birth year] MRN: [id number]  Date of Service  08/29/2013   HPI/Events of Note  Confused on camera, getting oob, fall   eICU Interventions  Soft posey   Intervention Category Intermediate Interventions: Other:  Andrey Mccaskill V. 08/29/2013, 3:51 AM

## 2013-08-30 ENCOUNTER — Inpatient Hospital Stay (HOSPITAL_COMMUNITY): Payer: Medicare Other

## 2013-08-30 DIAGNOSIS — R29818 Other symptoms and signs involving the nervous system: Secondary | ICD-10-CM

## 2013-08-30 DIAGNOSIS — R1310 Dysphagia, unspecified: Secondary | ICD-10-CM

## 2013-08-30 LAB — GLUCOSE, CAPILLARY
GLUCOSE-CAPILLARY: 188 mg/dL — AB (ref 70–99)
GLUCOSE-CAPILLARY: 176 mg/dL — AB (ref 70–99)
GLUCOSE-CAPILLARY: 227 mg/dL — AB (ref 70–99)
Glucose-Capillary: 162 mg/dL — ABNORMAL HIGH (ref 70–99)
Glucose-Capillary: 153 mg/dL — ABNORMAL HIGH (ref 70–99)
Glucose-Capillary: 226 mg/dL — ABNORMAL HIGH (ref 70–99)

## 2013-08-30 MED ORDER — RESOURCE THICKENUP CLEAR PO POWD
ORAL | Status: DC | PRN
Start: 2013-08-30 — End: 2013-09-06
  Filled 2013-08-30: qty 125

## 2013-08-30 NOTE — Progress Notes (Addendum)
PULMONARY / CRITICAL CARE MEDICINE   Name: Vaughan BastaJasper Gilkes MRN: [id number] DOB: 1940/12/30    ADMISSION DATE:  08/17/2013  PRIMARY SERVICE: PCCM  CHIEF COMPLAINT:  Respiratory distress.   BRIEF PATIENT DESCRIPTION:  73 y/o with DM, HTN and COPD. Presented from [facility] 6/27 with acute hypoxemic respiratory failure, secondary to ARDS in setting of septic shock.  SIGNIFICANT EVENTS / STUDIES:  6/27 - Admit w resp fx, cxr w bilateral patchy infiltrates.  [facility]ub[facility] & [facility] 6[facility]28 - ECHO (LVEF 60-65%, Grade 1 Diastolic dysfunction) 6/29 - paralyzed, peep 12, pressors required 6/30 - FOB: suctioned complete obstruction  7/01 - CXR unchanged diffuse b/l edema / infiltrate 7/02 - neg 4.6 liters 7/03 - hypoglycemia event (cbg 34), alert, on fentanyl only 7/04 - weaned most of day, marginal mental status 7/05 - weaning PSV (low volumes, mild tachypnea) 7/06 - continue wean with poor progress, CXR significantly improved aeration, b/l lower lobe atelectasis 7/07 - continue wean, improved progress, anticipate trial extubation on 7/8 7/08 -  extubation, CXR mostly stable with inc LLL opacity 7/09 - transfer to SDU, s/p extubation 7/8, CXR stable, eval for CIR placement  LINES / TUBES: 6/27 ETT >> 7/08 6/28 R rad a line>>>7/02  6/27 L [place] CVC >> 7/09   CULTURES: Bood cultures 6/27 >>> Negative Tracheal aspirate 6/27 >>> negative Urine culture 6/27 >>> Negative Resp virus PCR 6/28 >>> Negative Urine leg 6/28 >>> Negative Urine strep 6/28 >>> Negative Bronch 6/30 >>> oropharyngeal flora, HSV neg C.Diff 7/6 >> Negative  ANTIBIOTICS: - Completed levaquin for COPD exacerbation PTA.  Zosyn 6/27>>>6/29 Imipenem 6/29>>>7/1 Vancomycin 6/27>>> 6/30 Rocephin 7/1 >>> 7/8  SUBJECTIVE:  Transferred to SDU yesterday Passed MBS > moderate dysphagia Feels a little short of breath right now   VITAL SIGNS: Temp:  [97.2 F (36.2 C)-98.1 F (36.7 C)] 97.2 F (36.2 C) (07/10  0700) Pulse Rate:  [84-104] 104 (07/10 1124) Resp:  [30-39] 32 (07/10 0700) BP: (102-156)/(60-87) 138/76 mmHg (07/10 0700) SpO2:  [94 %-100 %] 95 % (07/10 1124) HEMODYNAMICS:   VENTILATOR SETTINGS:   INTAKE / OUTPUT: Intake/Output     07/09 0701 - 07/10 0700 07/10 0701 - 07/11 0700   I.V. (mL/kg) 450 (7) 75 (1.2)   Total Intake(mL/kg) 450 (7) 75 (1.2)   Urine (mL/kg/hr) 1425 (0.9) 300 (0.9)   Stool     Total Output 1425 300   Net -975 -225        Stool Occurrence 1 x      PHYSICAL EXAMINATION:  Gen: tachypnic but speaking in full sentences HEENT: OP clear, NCAT PULM: Diminished bases, few crackles CV: RRR, no mgr AB: BS+, soft, nontender Ext: warm no edema Neuro: awake and alert, maew  LABS: I have reviewed all of today's lab results. Relevant abnormalities are discussed in the A/P section  CXR: 7/10 pending  ASSESSMENT / PLAN:  PULMONARY A: Acute hypoxemic and hypercarbic respiratory failure due to ARDS from HCAP >7/10 tachypnic > suspect due to resolving ALI, does not appear worse than las few days COPD > not in exacerbation Dysphagia P:   - CXR now - maintain in SDu - dysphagia diet - Cont supplemental O2 - Cont duoneb scheduled - Incentive spirometry, flutter, OOB  CARDIOVASCULAR A: Septic Shock / Hypotension, resolved Diastolic CHF compensated Hypertension, controlled P:  - Cont amlodipine - Monitor fluid status  RENAL A:   Hypernatremia, resolved Metabolic Alkalosis, resolved Hypokalemia, resolved Hyponatremia, resolved P:   -  Monitor BMET intermittently - Monitor I/Os - Correct electrolytes as indicated - d/c IVF  GASTROINTESTINAL A:   N/V, resolved Diarrhea, C diff neg Post extubation dysphagia  P:  - Dysphagia diet  HEMATOLOGIC A:   Anemia, macrocytic P:  - DVT px: LMWH - Monitor CBC intermittently - Cont empiric folate  INFECTIOUS A:   HCAP, treated P:   - Micro and abx as above  ENDOCRINE A:   DM 2 Episodic  hypoglycemia P:   - Cont CBGs/SSI  NEUROLOGIC A:   Acute Encephalopathy, improved to resolved Mild pain from lying in bed Profound deconditioning P:   - Fentanyl PRN - CIR   GLOBAL:  Maintain in SDU, will need transfer to CIR in next 48-72 hours  Heber Colman, MD [place] PCCM Pager: [phone] Cell: [phone] If no response, call [phone]   08/30/2013 12:01 PM

## 2013-08-30 NOTE — Progress Notes (Signed)
Speech Language Pathology Treatment: Dysphagia  Patient Details Name: [name] [name] MRN: [id number] DOB: March 30, 1940 Today's Date: 08/30/2013 Time: [id number] SLP Time Calculation (min): 13 min  Assessment / Plan / Recommendation Clinical Impression  Pt demonstrates minimal improvement in ability to swallow purees and thin liquids. There are fewer immediate signs of aspiration, but pt still evidences weak swallow response and suspicion for silent aspiration. He is following cues for throat clearing and second swallows, but cough is still very weak. Will proceed with MBS to objectively evaluate swallow and determine if pt may start any POs.    HPI HPI: 73 y/o with DM, HTN and COPD. [facility] 6/27 with acute hypoxemic respiratory failure, secondary to ARDS in setting of septic shock. Intubated from 6/27 to 7/8. Son reports coughing with liquids and expectoration of mucous at baseline.   Pertinent Vitals NA  SLP Plan  MBS    Recommendations                Oral Care Recommendations: Oral care Q4 per protocol Follow up Recommendations: Inpatient Rehab Plan: MBS    GO    Harlon DittyBonnie Mixtli Reno, MA CCC-SLP [phone]  Claudine MoutonDeBlois, Vincente Asbridge Caroline 08/30/2013, 8:57 AM

## 2013-08-30 NOTE — Progress Notes (Signed)
I await medical readiness to admit pt to inpt rehab. ? Today. Pt down for MBS now. Wife and pt are aware of pending medical readiness to admit to inpt rehab. [phone]

## 2013-08-30 NOTE — Progress Notes (Signed)
Physical Therapy Treatment Patient Details Name: [name] [name] MRN: [id number] DOB: 1940-09-19 Today's Date: 08/30/2013    History of Present Illness Pt admit with VDRF with COPD exacerbation.  REcent admit for COPD exacerbation.     PT Comments    Pt with flat affect but pleasant throughout who needs cues and assist for mobility and safety . Pt with sats 90-95% on RA with all activity and HR 90-104. Pt educated for bil LE HEP and mobility but fatigues quickly and could not perform further. Pt in chair with 3 family members present with RN aware due to lack of chair alarm. Will continue to follow.   Follow Up Recommendations  CIR;Supervision/Assistance - 24 hour     Equipment Recommendations       Recommendations for Other Services       Precautions / Restrictions Precautions Precautions: Fall    Mobility  Bed Mobility Overal bed mobility: Needs Assistance Bed Mobility: Supine to Sit     Supine to sit: Min assist     General bed mobility comments: cues for sequence with assist to clear bil LE off bed and HHA to elevate trunk from surface  Transfers       Sit to Stand: Min assist Stand pivot transfers: Min assist       General transfer comment: cues for hand placement, safety and sequence pt at times moving before lines out of the way  Ambulation/Gait Ambulation/Gait assistance: Min assist Ambulation Distance (Feet): 18 Feet Assistive device: Rolling walker (2 wheeled) Gait Pattern/deviations: Trunk flexed;Shuffle   Gait velocity interpretation: <1.8 ft/sec, indicative of risk for recurrent falls General Gait Details: cues for posture, position in RW and looking up, limited by fatigue and stool incontinence with gait   Stairs            Wheelchair Mobility    Modified Rankin (Stroke Patients Only)       Balance Overall balance assessment: Needs assistance   Sitting balance-Leahy Scale: Fair       Standing balance-Leahy Scale: Poor                       Cognition Arousal/Alertness: Awake/alert Behavior During Therapy: Flat affect   Area of Impairment: Orientation Orientation Level: Time;Place;Situation     Following Commands: Follows one step commands with increased time Safety/Judgement: Decreased awareness of deficits;Decreased awareness of safety   Problem Solving: Difficulty sequencing;Requires verbal cues      Exercises General Exercises - Lower Extremity Long Arc Quad: AROM;Both;10 reps;Seated Hip Flexion/Marching: AROM;Seated;Both;10 reps    General Comments        Pertinent Vitals/Pain No pain    Home Living                      Prior Function            PT Goals (current goals can now be found in the care plan section) Progress towards PT goals: Progressing toward goals    Frequency       PT Plan Current plan remains appropriate    Co-evaluation             End of Session Equipment Utilized During Treatment: Gait belt Activity Tolerance: Patient limited by fatigue Patient left: in chair;with call bell/phone within reach;with family/visitor present     Time: [id number] PT Time Calculation (min): 27 min  Charges:  $Gait Training: 8-22 mins $Therapeutic Activity: 8-22 mins  G CodesToney Sang Beth 09/17/2013, 11:27 AM Delaney Meigs, PT [phone]

## 2013-08-30 NOTE — Progress Notes (Signed)
Noted PCCM pt tachypnic with activity. Will hold admit to inpt rehab today. We will follow up Monday. RN is aware of plan. [phone]

## 2013-08-30 NOTE — Procedures (Addendum)
Objective Swallowing Evaluation: Modified Barium Swallowing Study  Patient Details  Name: [name] [name] MRN: [id number] Date of Birth: Aug 07, 1940  Today's Date: 08/30/2013 Time: 1000-1020 SLP Time Calculation (min): 20 min  Past Medical History:  Past Medical History  Diagnosis Date  . Hypertension   . Diabetes mellitus without complication   . COPD (chronic obstructive pulmonary disease)   . Dementia     per family reports mild forgetfulness   Past Surgical History: History reviewed. No pertinent past surgical history. HPI:  73 y/o with DM, HTN and COPD. Presented from [facility] 6/27 with acute hypoxemic respiratory failure, secondary to ARDS in setting of septic shock. Intubated from 6/27 to 7/8. Son reports coughing with liquids and expectoration of mucous at baseline.     Assessment / Plan / Recommendation Clinical Impression  Dysphagia Diagnosis: Moderate pharyngeal phase dysphagia Clinical impression: Pt demonstrates a primary sensory based dysphagia with delayed swallow initiation and reduced sensation of penetrates. Thin liquids pool in the vallecuale and pyriforms prior to the swallow and are penetrated before and during the swallow with most trials. Pt has no sensation of this and cough/throat clear is too weak to effectively expel the penetrate with cues. A chin tuck was ineffective. Nectar thick liquids and solids were tolerated without penetration or residue. The pt is recommended to consume a dys 3 (mechanical soft) diet with nectar thick liquids. SLP will f/u for tolerance and advancement.     Treatment Recommendation  Therapy as outlined in treatment plan below    Diet Recommendation Dysphagia 3 (Mechanical Soft);Nectar thick liquid   Liquid Administration via: Cup;Straw Medication Administration: Whole meds with puree Supervision: Staff to assist with self feeding Postural Changes and/or Swallow Maneuvers: Out of bed for meals;Seated upright 90  degrees    Other  Recommendations Oral Care Recommendations: Oral care BID Other Recommendations: Order thickener from pharmacy   Follow Up Recommendations  Inpatient Rehab    Frequency and Duration min 2x/week  2 weeks   Pertinent Vitals/Pain NA    SLP Swallow Goals     General HPI: 73 y/o with DM, HTN and COPD. Presented from [facility] 6/27 with acute hypoxemic respiratory failure, secondary to ARDS in setting of septic shock. Intubated from 6/27 to 7/8. Son reports coughing with liquids and expectoration of mucous at baseline. Type of Study: Modified Barium Swallowing Study Reason for Referral: Objectively evaluate swallowing function Previous Swallow Assessment: BSE 08/29/13 - NPO Diet Prior to this Study: NPO Temperature Spikes Noted: No Respiratory Status: Nasal cannula History of Recent Intubation: Yes Length of Intubations (days): 12 days Date extubated: 08/28/13 Behavior/Cognition: Alert;Cooperative;Confused Oral Cavity - Dentition: Adequate natural dentition Oral Motor / Sensory Function: Within functional limits Self-Feeding Abilities: Needs assist;Able to feed self Patient Positioning: Upright in chair Baseline Vocal Quality: Hoarse;Low vocal intensity Volitional Cough: Congested;Weak Volitional Swallow: Able to elicit Anatomy: Within functional limits Pharyngeal Secretions: Not observed secondary MBS    Reason for Referral Objectively evaluate swallowing function   Oral Phase Oral Preparation/Oral Phase Oral Phase: WFL   Pharyngeal Phase Pharyngeal Phase Pharyngeal Phase: Impaired Pharyngeal - Nectar Pharyngeal - Nectar Cup: Delayed swallow initiation;Reduced laryngeal elevation Pharyngeal - Nectar Straw: Delayed swallow initiation;Reduced laryngeal elevation Pharyngeal - Thin Pharyngeal - Thin Cup: Delayed swallow initiation;Reduced laryngeal elevation;Penetration/Aspiration before swallow;Penetration/Aspiration during  swallow Penetration/Aspiration details (thin cup): Material enters airway, remains ABOVE vocal cords and not ejected out;Material enters airway, CONTACTS cords and not ejected out Pharyngeal - Thin Straw: Delayed  swallow initiation;Reduced laryngeal elevation;Penetration/Aspiration before swallow;Penetration/Aspiration during swallow Penetration/Aspiration details (thin straw): Material enters airway, remains ABOVE vocal cords and not ejected out;Material enters airway, CONTACTS cords and not ejected out Pharyngeal - Solids Pharyngeal - Puree: Delayed swallow initiation Pharyngeal - Regular: Delayed swallow initiation Pharyngeal - Pill: Delayed swallow initiation (pill masticated)  Cervical Esophageal Phase    GO    Cervical Esophageal Phase Cervical Esophageal Phase: [facility]        [name] DittyBonnie Abbagail Scaff, MA CCC-SLP [phone]  [name] [name], [name] [name] 08/30/2013, 10:48 AM

## 2013-08-30 NOTE — Progress Notes (Signed)
PT Cancellation Note  Patient Details Name: [name] [name] MRN: [id number] DOB: Mar 13, 1940   Cancelled Treatment:    Reason Eval/Treat Not Completed: Patient at procedure or test/unavailable   Toney Sangabor, Everly Rubalcava Beth 08/30/2013, 10:19 AM Delaney MeigsMaija Tabor Gabrielly Mccrystal, PT [phone]

## 2013-08-31 ENCOUNTER — Inpatient Hospital Stay (HOSPITAL_COMMUNITY): Payer: Medicare Other | Admitting: *Deleted

## 2013-08-31 ENCOUNTER — Inpatient Hospital Stay (HOSPITAL_COMMUNITY): Payer: Medicare Other | Admitting: Speech Pathology

## 2013-08-31 LAB — BLOOD GAS, ARTERIAL
Acid-Base Excess: 9.7 mmol/L — ABNORMAL HIGH (ref 0.0–2.0)
Bicarbonate: 36.9 mEq/L — ABNORMAL HIGH (ref 20.0–24.0)
Drawn by: 21338
O2 Content: 3 L/min
O2 SAT: 99 %
PATIENT TEMPERATURE: 98.6
TCO2: 39.6 mmol/L (ref 0–100)
pCO2 arterial: 87.9 mmHg (ref 35.0–45.0)
pH, Arterial: 7.247 — ABNORMAL LOW (ref 7.350–7.450)
pO2, Arterial: 159 mmHg — ABNORMAL HIGH (ref 80.0–100.0)

## 2013-08-31 LAB — BASIC METABOLIC PANEL
ANION GAP: 9 (ref 5–15)
BUN: 13 mg/dL (ref 6–23)
CHLORIDE: 102 meq/L (ref 96–112)
CO2: 32 mEq/L (ref 19–32)
Calcium: 8.9 mg/dL (ref 8.4–10.5)
Creatinine, Ser: 0.57 mg/dL (ref 0.50–1.35)
GFR calc Af Amer: >90 mL/min (ref >90)
GFR calc non Af Amer: >90 mL/min (ref >90)
Glucose, Bld: 146 mg/dL — ABNORMAL HIGH (ref 70–99)
Potassium: 4.7 mEq/L (ref 3.7–5.3)
Sodium: 143 mEq/L (ref 137–147)

## 2013-08-31 LAB — GLUCOSE, CAPILLARY
GLUCOSE-CAPILLARY: 161 mg/dL — AB (ref 70–99)
GLUCOSE-CAPILLARY: 136 mg/dL — AB (ref 70–99)
Glucose-Capillary: 180 mg/dL — ABNORMAL HIGH (ref 70–99)

## 2013-08-31 MED ORDER — FUROSEMIDE 10 MG/ML IJ SOLN
40.0000 mg | Freq: Once | INTRAMUSCULAR | Status: AC
  Administered 2013-08-31: 40 mg via INTRAVENOUS
  Filled 2013-08-31: qty 4

## 2013-08-31 MED ORDER — FOLIC ACID 1 MG PO TABS
1.0000 mg | ORAL_TABLET | Freq: Every day | ORAL | Status: DC
  Administered 2013-09-03 – 2013-09-05 (×3): 1 mg via ORAL
  Filled 2013-08-31 (×5): qty 1

## 2013-08-31 NOTE — Progress Notes (Signed)
Clinical Social Work Department CLINICAL SOCIAL WORK PLACEMENT NOTE 08/31/2013  Patient:  [name] [name],[name]  Account Number:  [id number] Admit date:  08/17/2013  Clinical Social Worker:  Jetta LoutBAILEY MORGAN, Theresia MajorsLCSWA  Date/time:  08/31/2013 06:08 PM  Clinical Social Work is seeking post-discharge placement for this patient at the following level of care:   SKILLED NURSING   (*CSW will update this form in Epic as items are completed)   08/31/2013  Patient/family provided with [facility] Department of Clinical Social Work's list of facilities offering this level of care within the geographic area requested by the patient (or if unable, by the patient's family).  08/31/2013  Patient/family informed of their freedom to choose among providers that offer the needed level of care, that participate in Medicare, Medicaid or managed care program needed by the patient, have an available bed and are willing to accept the patient.  08/31/2013  Patient/family informed of [facility]' ownership interest in [facility], as well as of the fact that they are under no obligation to receive care at this facility.  PASARR submitted to EDS on 08/31/2013 PASARR number received on 08/31/2013  FL2 transmitted to all facilities in geographic area requested by pt/family on  08/31/2013 FL2 transmitted to all facilities within larger geographic area on   Patient informed that his/her managed care company has contracts with or will negotiate with  certain facilities, including the following:     Patient/family informed of bed offers received:   Patient chooses bed at  Physician recommends and patient chooses bed at    Patient to be transferred to  on   Patient to be transferred to facility by  Patient and family notified of transfer on  Name of family member notified:    The following physician request were entered in Epic:   Additional Comments:

## 2013-08-31 NOTE — Progress Notes (Signed)
Pt family now decided on no intubation, no CPR, no medications, and no "shocking". They understand that it is a DNR that they want. All in agreement. RN explained that to circulate medications, your heart needs to beat. They understand that little medication will get to the heart without CPR. They do not want ribs to be broken.

## 2013-08-31 NOTE — Progress Notes (Signed)
[facility] PCCM  Called to patient bedside to evaluate respiratory distress Mr. Stults reports that the is feeling worse today.  Filed Vitals:   08/31/13 0800 08/31/13 0900 08/31/13 1137 08/31/13 1428  BP:   134/53   Pulse: 79  93 92  Temp:   97.7 F (36.5 C)   TempSrc:   Axillary   Resp: 30  30 38  Height:      Weight:      SpO2: 100% 98% 99% 93%    Gen: increased work of breathing, weak HEENT: accessory muscle use noted PULM: Crackles bilaterally CV: Tachy, regular AB: soft nontender Ext: warm, no edema Neuro: Weak but awake, answering questions appropriately  Impression: 1) Acute respiratory failure due to Acute lung injury and +/- cardiogenic pulmonary edema; ABG with hypercapnic respiratory failure and markedly elevated paO2 2) Severe deconditioning 3) mild dementia  Plan: Situation discussed with the patient and his family.  The patient stated on multiple occassions that he does not want to be intubated again even though I explained to him he may die without it.  His response to that was, "Get me the word of God.".    1) BIPAP now 2) KVO fluids now 3) Lasix now 4) Family to meet now and discuss code status with their father.  It sounds like they are supportive of his decision but they want to meet as a family now (one son here, the other is on his way).  Will place DNR order once they have met 5) comfort measure if fails lasix, bipap, and if family agrees to DNR.  CC time by me 40 minutes.  Roselie Awkward, MD [place] PCCM Pager: [phone] Cell: [phone] If no response, call [phone]

## 2013-08-31 NOTE — Progress Notes (Signed)
Pt agitated.  Pulling at bipap, attempting to get OOB.  Sats dropping to 70's on bipap, resp rate in the 40's.  Requested elink to camera in.  Fentanyl 25 mcg given per elink.  Will continue to follow.

## 2013-08-31 NOTE — Progress Notes (Addendum)
Took oxygen off patient per Dr. Sherene SiresWert. Will continue to monitor.   12:38 PM Had to put oxygen [place] back on patient at 1L. Pt desat'd to 78%

## 2013-08-31 NOTE — Progress Notes (Signed)
Pt family decided on not making pt a DNR and instead doing a tracheostomy if needed. Dr. Katrinka BlazingSmith over in E-Link informed.

## 2013-08-31 NOTE — Progress Notes (Signed)
Clinical Social Work Department BRIEF PSYCHOSOCIAL ASSESSMENT 08/31/2013  Patient:  COWEN, PESQUEIRA     Account Number:  [id number]     Admit date:  08/17/2013  Clinical Social Worker:  Rolinda Roan  Date/Time:  08/31/2013 06:03 PM  Referred by:  Physician  Date Referred:  08/31/2013 Referred for  SNF Placement   Other Referral:   Interview type:  Family Other interview type:    PSYCHOSOCIAL DATA Living Status:  WIFE Admitted from facility:   Level of care:   Primary support name:  Stevon Gough Primary support relationship to patient:  SPOUSE Degree of support available:   Good support at bedside.    CURRENT CONCERNS  Other Concerns:    SOCIAL WORK ASSESSMENT / PLAN Clinical Education officer, museum (CSW) met with patient's wife and sister in law [place]. Patient was asleep and did not participate in assessment. Wife is agreeable to SNF search in [place]. Wife reported she prefers CIR. CSW explained that SNF is a back up to CIR in case patient does not get in. Wife verbalized her understanding.   Assessment/plan status:  Psychosocial Support/Ongoing Assessment of Needs Other assessment/ plan:   Information/referral to community resources:   CSW gave wife SNF list.    PATIENT'S/FAMILY'S RESPONSE TO PLAN OF CARE: Wife thanked CSW for visit and assisting with placement process.

## 2013-08-31 NOTE — Progress Notes (Signed)
PULMONARY / CRITICAL CARE MEDICINE   Name: [name] [name] MRN: [id number] DOB: 29-Nov-1940    ADMISSION DATE:  08/17/2013  PRIMARY SERVICE: PCCM  CHIEF COMPLAINT:  Respiratory distress.   BRIEF PATIENT DESCRIPTION:  [age] yobm with DM, HTN and COPD. Presented from [facility] 6/27 with acute hypoxemic respiratory failure, secondary to ARDS in setting of septic shock.  SIGNIFICANT EVENTS / STUDIES:  6/27 - Admit w resp fx, cxr w bilateral patchy infiltrates.  Intub[facility] & [facility] 6/28 - ECHO (LVEF 60-65%, Grade 1 Diastolic dysfunction) 6/29 - paralyzed, peep 12, pressors required 6/30 - FOB: suctioned complete obstruction  7/01 - CXR unchanged diffuse b/l edema / infiltrate 7/02 - neg 4.6 liters 7/03 - hypoglycemia event (cbg 34), alert, on fentanyl only 7/04 - weaned most of day, marginal mental status 7/05 - weaning PSV (low volumes, mild tachypnea) 7/06 - continue wean with poor progress, CXR significantly improved aeration, b/l lower lobe atelectasis 7/07 - continue wean, improved progress, anticipate trial extubation on 7/8 7/08 -  extubation, CXR mostly stable with inc LLL opacity 7/09 - transfer to SDU, s/p extubation 7/8, CXR stable, eval for CIR placement 7/10 ST >dys 3 (mechanical soft) diet with nectar thick liquids.   LINES / TUBES: 6/27 ETT >> 7/08 6/28 R rad a line>>>7/02  6/27 L [place] CVC >> 7/09   CULTURES: Bood cultures 6/27 >>> Negative Tracheal aspirate 6/27 >>> negative Urine culture 6/27 >>> Negative Resp virus PCR 6/28 >>> Negative Urine leg 6/28 >>> Negative Urine strep 6/28 >>> Negative Bronch 6/30 >>> oropharyngeal flora, HSV neg C.Diff 7/6 >> Negative  ANTIBIOTICS: - Completed levaquin for COPD exacerbation PTA.  Zosyn 6/27>>>6/29 Imipenem 6/29>>>7/1 Vancomycin 6/27>>> 6/30 Rocephin 7/1 >>> 7/8  SUBJECTIVE:  Sleeping on my arrival, wife reports up all night but no resp distress on nasal 02    VITAL SIGNS: Temp:  [96.9 F (36.1  C)-98 F (36.7 C)] 97.6 F (36.4 C) (07/11 0754) Pulse Rate:  [84-104] 84 (07/11 0754) Resp:  [26-39] 26 (07/11 0754) BP: (135-143)/(69-100) 135/100 mmHg (07/11 0754) SpO2:  [97 %-100 %] 98 % (07/11 0900) HEMODYNAMICS:   VENTILATOR SETTINGS:   INTAKE / OUTPUT: Intake/Output     07/10 0701 - 07/11 0700 07/11 0701 - 07/12 0700   I.V. (mL/kg) 375 (5.8) 443.8 (6.9)   Total Intake(mL/kg) 375 (5.8) 443.8 (6.9)   Urine (mL/kg/hr) 1525 (1) 425 (1.5)   Total Output 1525 425   Net -1150 +18.8        Stool Occurrence 2 x      PHYSICAL EXAMINATION:  Gen: chronically ill pos 0 sign sleeping HEENT: OP clear, NCAT PULM: Diminished bases, few crackles CV: RRR, no mgr AB: BS+, soft, nontender Ext: warm no edema Neuro: awake and alert, maew  LABS: I have reviewed all of today's lab results. Relevant abnormalities are discussed in the A/P section  CXR: 7/10 1. Persistent left lower lobe airspace disease, concerning for  pneumonia.  2. Otherwise improving aeration   ASSESSMENT / PLAN:  PULMONARY A: Acute hypoxemic and hypercarbic respiratory failure due to ARDS from HCAP >7/10 tachypnic > suspect due to resolving ALI, does not appear worse than las few days COPD > not in exacerbation Dysphagia P:    - maintain in SDu - dysphagia diet - Cont supplemental O2 - Cont duoneb scheduled - Incentive spirometry, flutter, OOB  CARDIOVASCULAR A: Septic Shock / Hypotension, resolved Diastolic CHF compensated Hypertension, controlled P:  - Cont amlodipine -  Monitor fluid status  RENAL A:   Hypernatremia, resolved Metabolic Alkalosis, resolved Hypokalemia, resolved Hyponatremia, resolved P:   - Monitor BMET intermittently - Monitor I/Os - Correct electrolytes as indicated   GASTROINTESTINAL A:   N/V, resolved Diarrhea, C diff neg Post extubation dysphagia  P:  - Dysphagia diet per ST (see 7/10 eval)  HEMATOLOGIC A:   Anemia, macrocytic P:  - DVT px: LMWH -  Monitor CBC intermittently - Cont empiric folate  INFECTIOUS A:   HCAP, treated P:   - Micro and abx as above- off all abx as of 7/8 so watch for recurrent asp/ hcap with residual cxr changes noted 7/10  ENDOCRINE A:   DM 2 Episodic hypoglycemia P:   - Cont CBGs/SSI  NEUROLOGIC A:   Acute Encephalopathy, improved to resolved Mild pain from lying in bed Profound deconditioning P:   - Fentanyl PRN - CIR   GLOBAL:  Maintain in SDU, will need transfer to CIR in next 48-72 hours    Sandrea Hughs, MD Pulmonary and Critical Care Medicine [facility] Cell [phone] After 5:30 PM or weekends, call [phone]

## 2013-09-01 LAB — GLUCOSE, CAPILLARY
GLUCOSE-CAPILLARY: 184 mg/dL — AB (ref 70–99)
GLUCOSE-CAPILLARY: 133 mg/dL — AB (ref 70–99)
Glucose-Capillary: 132 mg/dL — ABNORMAL HIGH (ref 70–99)
Glucose-Capillary: 162 mg/dL — ABNORMAL HIGH (ref 70–99)
Glucose-Capillary: 175 mg/dL — ABNORMAL HIGH (ref 70–99)
Glucose-Capillary: 189 mg/dL — ABNORMAL HIGH (ref 70–99)
Glucose-Capillary: 244 mg/dL — ABNORMAL HIGH (ref 70–99)

## 2013-09-01 MED ORDER — MORPHINE SULFATE 2 MG/ML IJ SOLN
INTRAMUSCULAR | Status: AC
  Filled 2013-09-01: qty 1

## 2013-09-01 MED ORDER — MORPHINE SULFATE 2 MG/ML IJ SOLN
2.0000 mg | Freq: Once | INTRAMUSCULAR | Status: AC
  Administered 2013-09-01: 2 mg via INTRAVENOUS

## 2013-09-01 NOTE — Progress Notes (Signed)
eLink Physician-Brief Progress Note Patient Name: Vaughan BastaJasper Horne DOB: April 25, 1940 MRN: [id number]  Date of Service  09/01/2013   HPI/Events of Note  Long discussion with family.  Patient more lethargic.  Patient indicating to nurses he does not want Bipap.  Family feels patient needs and wants Bipap.  Discussed with family interacting with patient to help determine patient wishes for long term care.  Even though Bipap helps resp status intermittently may be uncomfortable for patient and he may not want.  Family feels patient does not entirely understand situation at times and he would want it if he understood.  eICU Interventions  Bipap intermittently to assist work of breathing and CO2 retention.   Intervention Category Major Interventions: End of life / care limitation discussion  Henry RusselSMITH, Cynia Abruzzo, Demetrius Charity 09/01/2013, 6:50 PM

## 2013-09-01 NOTE — Progress Notes (Signed)
Pts family called staff to room stating that they would like for patient to be placed in bed because pt was "unresponsive."  Pt assisted with transition to bed from chair with RN and family assistance.  Pt placed back on BIPAP per family's request.  Will cont to closely monitor.  Farra Nikolic, [facility]

## 2013-09-01 NOTE — Progress Notes (Signed)
PULMONARY / CRITICAL CARE MEDICINE   Name: [name] [name] MRN: [id number] DOB: 1940/08/21    ADMISSION DATE:  08/17/2013  PRIMARY SERVICE: PCCM  CHIEF COMPLAINT:  Respiratory distress.   BRIEF PATIENT DESCRIPTION:  6 yobm with DM, HTN and COPD. Presented from [facility] 6/27 with acute hypoxemic respiratory failure, secondary to ARDS in setting of septic shock.  SIGNIFICANT EVENTS / STUDIES:  6/27 - Admit w resp fx, cxr w bilateral patchy infiltrates.  Intubated & tx to [facility] 6/28 - ECHO (LVEF 60-65%, Grade 1 Diastolic dysfunction) 6/29 - paralyzed, peep 12, pressors required 6/30 - FOB: suctioned complete obstruction  7/01 - CXR unchanged diffuse b/l edema / infiltrate 7/02 - neg 4.6 liters 7/03 - hypoglycemia event (cbg 34), alert, on fentanyl only 7/04 - weaned most of day, marginal mental status 7/05 - weaning PSV (low volumes, mild tachypnea) 7/06 - continue wean with poor progress, CXR significantly improved aeration, b/l lower lobe atelectasis 7/07 - continue wean, improved progress, anticipate trial extubation on 7/8 7/08 -  extubation, CXR mostly stable with inc LLL opacity 7/09 - transfer to SDU, s/p extubation 7/8, CXR stable, eval for CIR placement 7/10 ST >dys 3 (mechanical soft) diet with nectar thick liquids. 7/11 resp distress/ placed back on bipap, NCB status   LINES / TUBES: 6/27 ETT >> 7/08 6/28 R rad a line>>>7/02  6/27 L [place] CVC >> 7/09   CULTURES: Bood cultures 6/27 >>> Negative Tracheal aspirate 6/27 >>> negative Urine culture 6/27 >>> Negative Resp virus PCR 6/28 >>> Negative Urine leg 6/28 >>> Negative Urine strep 6/28 >>> Negative Bronch 6/30 >>> oropharyngeal flora, HSV neg C.Diff 7/6 >> Negative  ANTIBIOTICS: - Completed levaquin for COPD exacerbation PTA.  Zosyn 6/27>>>6/29 Imipenem 6/29>>>7/1 Vancomycin 6/27>>> 6/30 Rocephin 7/1 >>> 7/8  SUBJECTIVE:  Rough day 7/11 with resp distress after 02 reduced to improve resp  drive but then required bipap and fentanyl for increaesed resp distress    VITAL SIGNS: Temp:  [97.3 F (36.3 C)-98.1 F (36.7 C)] 97.5 F (36.4 C) (07/12 0805) Pulse Rate:  [87-112] 103 (07/12 0805) Resp:  [19-48] 31 (07/12 0805) BP: (127-137)/(53-96) 137/96 mmHg (07/12 0357) SpO2:  [88 %-99 %] 98 % (07/12 0805) FiO2 (%):  [40 %-50 %] 40 % (07/12 0805) HEMODYNAMICS:   VENTILATOR SETTINGS: Vent Mode:  [-]  FiO2 (%):  [40 %-50 %] 40 % INTAKE / OUTPUT: Intake/Output     07/11 0701 - 07/12 0700 07/12 0701 - 07/13 0700   I.V. (mL/kg) 935 (14.5)    Total Intake(mL/kg) 935 (14.5)    Urine (mL/kg/hr) 2500 (1.6)    Total Output 2500 0   Net -1565 0        Urine Occurrence 200 x      Intake/Output Summary (Last 24 hours) at 09/01/13 1023 Last data filed at 09/01/13 0743  Gross per 24 hour  Intake 491.25 ml  Output   2075 ml  Net -1583.75 ml    PHYSICAL EXAMINATION:  Gen: chronically ill much more awake, nods he wants the bipap off HEENT: OP clear, NCAT PULM: Diminished bases, few crackles CV: RRR, no mgr AB: BS+, soft, nontender Ext: warm no edema Neuro: awake and alert, maew  LABS:  Recent Labs Lab 08/28/13 0430 08/29/13 0411 08/31/13 0234  NA 143 140 143  K 4.3 4.5 4.7  CL 99 98 102  CO2 36* 32 32  BUN 33* 17 13  CREATININE 0.68 0.53 0.57  GLUCOSE 192*  245* 146*    Recent Labs Lab 08/27/13 0500 08/28/13 0430 08/29/13 0411  HGB 9.4* 9.1* 10.2*  HCT 30.3* 29.2* 32.9*  WBC 10.4 10.5 7.9  PLT 315 339 397      CXR: 7/10 1. Persistent left lower lobe airspace disease, concerning for  pneumonia.  2. Otherwise improving aeration   ASSESSMENT / PLAN:  PULMONARY A: Acute hypoxemic and hypercarbic respiratory failure due to ARDS from HCAP >7/10 tachypneic > suspect due to resolving ALI, does not appear worse than las few days COPD > not in exacerbation Dysphagia P:    - maintain in SDU until we can keep him comfortable off bipap - dysphagia  diet - Cont supplemental O2 - Cont duoneb scheduled - Incentive spirometry, flutter, OOB as  Much as possible, discussed with nursing/RT/wife  CARDIOVASCULAR A: Septic Shock / Hypotension, resolved Diastolic CHF compensated Hypertension, controlled P:  - Cont amlodipine - Monitor fluid status  RENAL A:   Hypernatremia, resolved Metabolic Alkalosis, resolved Hypokalemia, resolved Hyponatremia, resolved P:   - Monitor BMET intermittently - Monitor I/Os - Correct electrolytes as indicated   GASTROINTESTINAL A:   N/V, resolved Diarrhea, C diff neg Post extubation dysphagia  P:  - Dysphagia diet per ST (see 7/10 eval)  HEMATOLOGIC A:   Anemia, macrocytic P:  - DVT px: LMWH - Monitor CBC intermittently - Cont empiric folate  INFECTIOUS A:   HCAP, treated P:   - Micro and abx as above- off all abx as of 7/8 so watch for recurrent asp/ hcap with residual cxr changes noted 7/10  ENDOCRINE A:   DM 2 Episodic hypoglycemia P:   - Cont CBGs/SSI  NEUROLOGIC A:   Acute Encephalopathy, improved to resolved Mild pain from lying in bed Profound deconditioning P:   - Fentanyl PRN - CIR vs SNF as ncb status now          Sandrea HughsMichael Wert, MD Pulmonary and Critical Care Medicine [facility] Cell [phone] After 5:30 PM or weekends, call [phone]

## 2013-09-02 DIAGNOSIS — F0390 Unspecified dementia without behavioral disturbance: Secondary | ICD-10-CM | POA: Diagnosis present

## 2013-09-02 DIAGNOSIS — I10 Essential (primary) hypertension: Secondary | ICD-10-CM | POA: Diagnosis present

## 2013-09-02 DIAGNOSIS — A419 Sepsis, unspecified organism: Secondary | ICD-10-CM | POA: Diagnosis present

## 2013-09-02 DIAGNOSIS — Z66 Do not resuscitate: Secondary | ICD-10-CM

## 2013-09-02 DIAGNOSIS — R6520 Severe sepsis without septic shock: Secondary | ICD-10-CM

## 2013-09-02 DIAGNOSIS — E119 Type 2 diabetes mellitus without complications: Secondary | ICD-10-CM | POA: Diagnosis present

## 2013-09-02 LAB — GLUCOSE, CAPILLARY
Glucose-Capillary: 137 mg/dL — ABNORMAL HIGH (ref 70–99)
Glucose-Capillary: 147 mg/dL — ABNORMAL HIGH (ref 70–99)
Glucose-Capillary: 164 mg/dL — ABNORMAL HIGH (ref 70–99)
Glucose-Capillary: 230 mg/dL — ABNORMAL HIGH (ref 70–99)
Glucose-Capillary: 162 mg/dL — ABNORMAL HIGH (ref 70–99)
Glucose-Capillary: 176 mg/dL — ABNORMAL HIGH (ref 70–99)

## 2013-09-02 MED ORDER — ONDANSETRON HCL 4 MG/2ML IJ SOLN
4.0000 mg | INTRAMUSCULAR | Status: DC | PRN
  Administered 2013-09-02: 4 mg via INTRAVENOUS

## 2013-09-02 MED ORDER — METHYLPREDNISOLONE SODIUM SUCC 40 MG IJ SOLR
40.0000 mg | Freq: Four times a day (QID) | INTRAMUSCULAR | Status: DC
  Administered 2013-09-02 – 2013-09-03 (×4): 40 mg via INTRAVENOUS
  Filled 2013-09-02 (×8): qty 1

## 2013-09-02 MED ORDER — ONDANSETRON HCL 4 MG/2ML IJ SOLN
INTRAMUSCULAR | Status: AC
  Filled 2013-09-02: qty 2

## 2013-09-02 NOTE — Progress Notes (Signed)
Noted events over the weekend. I will follow. [phone]

## 2013-09-02 NOTE — Progress Notes (Addendum)
PULMONARY / CRITICAL CARE MEDICINE   Name: [name] [name] MRN: [id number] DOB: 1940-03-19    ADMISSION DATE:  08/17/2013  PRIMARY SERVICE: PCCM  CHIEF COMPLAINT:  Respiratory distress.   BRIEF PATIENT DESCRIPTION:  [age] yobm with DM, HTN and COPD. Present[facility] from [facility] 6/27 with acute hypoxemic respiratory failure, secondary to ARDS in setting of septic shock.  SIGNIFICANT EVENTS / STUDIES:  6/27 - Admit w resp fx, cxr w bilateral patchy infiltrates.  [facility]ub[facility] & [facility] 6/28 - ECHO (LVEF 60-65%, Grade 1 Diastolic dysfunction) 6/29 - paralyzed, peep 12, pressors required 6/30 - FOB: suctioned complete obstruction  7/01 - CXR unchanged diffuse b/l edema / infiltrate 7/02 - neg 4.6 liters 7/03 - hypoglycemia event (cbg 34), alert, on fentanyl only 7/04 - weaned most of day, marginal mental status 7/05 - weaning PSV (low volumes, mild tachypnea) 7/06 - continue wean with poor progress, CXR significantly improved aeration, b/l lower lobe atelectasis 7/07 - continue wean, improved progress, anticipate trial extubation on 7/8 7/08 -  extubation, CXR mostly stable with inc LLL opacity 7/09 - transfer to SDU, s/p extubation 7/8, CXR stable, eval for CIR placement 7/10 ST >dys 3 (mechanical soft) diet with nectar thick liquids. 7/11 resp distress/ placed back on bipap, NCB status   LINES / TUBES: 6/27 ETT >> 7/08 6/28 R rad a line>>>7/02  6/27 L [place] CVC >> 7/09   CULTURES: Bood cultures 6/27 >>> Negative Tracheal aspirate 6/27 >>> negative Urine culture 6/27 >>> Negative Resp virus PCR 6/28 >>> Negative Urine leg 6/28 >>> Negative Urine strep 6/28 >>> Negative Bronch 6/30 >>> oropharyngeal flora, HSV neg C.Diff 7/6 >> Negative  ANTIBIOTICS: - Completed levaquin for COPD exacerbation PTA.  Zosyn 6/27>>>6/29 Imipenem 6/29>>>7/1 Vancomycin 6/27>>> 6/30 Rocephin 7/1 >>> 7/8  SUBJECTIVE:  Now on bipap continuously. Hypercarbic off bipap and then less  responsive. Family considering comfort care only   VITAL SIGNS: Temp:  [97.5 F (36.4 C)-97.8 F (36.6 C)] 97.5 F (36.4 C) (07/13 0700) Pulse Rate:  [81-116] 106 (07/13 0816) Resp:  [29-44] 35 (07/13 0816) BP: (123-142)/(70-71) 142/70 mmHg (07/13 0429) SpO2:  [95 %-100 %] 100 % (07/13 0816) FiO2 (%):  [40 %-50 %] 40 % (07/13 0816)    VENTILATOR SETTINGS: Vent Mode:  [-]  FiO2 (%):  [40 %-50 %] 40 % On bipap INTAKE / OUTPUT: Intake/Output     07/12 0701 - 07/13 0700 07/13 0701 - 07/14 0700   I.V. (mL/kg) 975 (15.2)    Total Intake(mL/kg) 975 (15.2)    Urine (mL/kg/hr) 500 (0.3)    Total Output 500     Net +475            Intake/Output Summary (Last 24 hours) at 09/02/13 0945 Last data filed at 09/02/13 0700  Gross per 24 hour  Intake    975 ml  Output    500 ml  Net    475 ml    PHYSICAL EXAMINATION:  Gen: chronically ill less alert, on bipap HEENT: OP clear, NCAT PULM: Diminished bases, few crackles CV: RRR, no mgr AB: BS+, soft, nontender Ext: warm no edema Neuro: awake and alert, maew  LABS:  Recent Labs Lab 08/28/13 0430 08/29/13 0411 08/31/13 0234  NA 143 140 143  K 4.3 4.5 4.7  CL 99 98 102  CO2 36* 32 32  BUN 33* 17 13  CREATININE 0.68 0.53 0.57  GLUCOSE 192* 245* 146*    Recent Labs Lab 08/27/13  0500 08/28/13 0430 08/29/13 0411  HGB 9.4* 9.1* 10.2*  HCT 30.3* 29.2* 32.9*  WBC 10.4 10.5 7.9  PLT 315 339 397      CXR: no film 7/13   ASSESSMENT / PLAN:  PULMONARY A: Acute hypoxemic and hypercarbic respiratory failure due to ARDS from HCAP >7/10 tachypneic > suspect due to resolving ALI, does not appear worse than las few days COPD > not in exacerbation Dysphagia P:    - maintain in SDU until we decide on comfort care only - dysphagia diet - Cont supplemental O2 - Cont duoneb scheduled -empiric IV medrol   CARDIOVASCULAR A: Septic Shock / Hypotension, resolved Diastolic CHF compensated Hypertension, controlled P:   - Cont amlodipine - Monitor fluid status Note has diuresed 8L and looks dry   RENAL A:   Hypernatremia, resolved Metabolic Alkalosis, resolved Hypokalemia, resolved Hyponatremia, resolved P:   - Monitor BMET intermittently - Monitor I/Os - Correct electrolytes as indicated   GASTROINTESTINAL A:   N/V, resolved Diarrhea, C diff neg Post extubation dysphagia  P:  - Dysphagia diet per ST (see 7/10 eval)  HEMATOLOGIC A:   Anemia, macrocytic P:  - DVT px: LMWH - Monitor CBC intermittently - Cont empiric folate  INFECTIOUS A:   HCAP, treated P:   Off all abx  ENDOCRINE A:   DM 2 Episodic hypoglycemia P:   - Cont CBGs/SSI  NEUROLOGIC A:   Acute Encephalopathy,resolved but recurs with elevated pCO2 Mild pain from lying in bed Profound deconditioning Hx of Dementia P:   - Fentanyl PRN  Family updated at bedside, they confirm no further intubations or vent/icu. They are considering comfort care only status  Dorcas Carrow Beeper  [phone]  Cell  [phone]  If no response or cell goes to voicemail, call beeper [phone]  9:45 AM 09/02/2013

## 2013-09-02 NOTE — Progress Notes (Signed)
SLP Cancellation Note  Patient Details Name: [name] [name] MRN: [id number] DOB: [birth year]   Cancelled treatment:       Reason Eval/Treat Not Completed: Medical issues which prohibited therapy. Noted pt now on Bipap; spoke with RN who suggested not doing diet check d/t respiratory issues. Will continue to follow.   [place]ro Kungle[place]ak, Amy K, MA, CCC-SLP 09/02/2013, 1:40 PM

## 2013-09-02 NOTE — Progress Notes (Signed)
Utilization review completed.  

## 2013-09-02 NOTE — Clinical Social Work Note (Signed)
SNF bed offers given to patient's wife.  Roddie McBryant Beulah Matusek MSW, Harrold[place]CSWA, [place], [phone]

## 2013-09-02 NOTE — Progress Notes (Signed)
Chaplain referred to family by unit secretary because pt is near EOL. Pt's wife, two sons, and other family members were in waiting area. Visited at some length with pt's wife. She and pt have been married 50 years. She loves him dearly but is at peace letting him go. She states that pt has been telling her that God is calling him home. Pt's wife is very strong in her [employer] Knuckles[employer] faith and has a strong family support system. Chaplain offered empathic listening and words of support to pt's wife and chaplain was himself inspired by her faith.

## 2013-09-03 DIAGNOSIS — F0390 Unspecified dementia without behavioral disturbance: Secondary | ICD-10-CM

## 2013-09-03 DIAGNOSIS — R6520 Severe sepsis without septic shock: Secondary | ICD-10-CM

## 2013-09-03 DIAGNOSIS — A419 Sepsis, unspecified organism: Secondary | ICD-10-CM

## 2013-09-03 LAB — GLUCOSE, CAPILLARY
GLUCOSE-CAPILLARY: 253 mg/dL — AB (ref 70–99)
Glucose-Capillary: 110 mg/dL — ABNORMAL HIGH (ref 70–99)
Glucose-Capillary: 151 mg/dL — ABNORMAL HIGH (ref 70–99)
Glucose-Capillary: 157 mg/dL — ABNORMAL HIGH (ref 70–99)
Glucose-Capillary: 168 mg/dL — ABNORMAL HIGH (ref 70–99)
Glucose-Capillary: 190 mg/dL — ABNORMAL HIGH (ref 70–99)
Glucose-Capillary: 196 mg/dL — ABNORMAL HIGH (ref 70–99)
Glucose-Capillary: 251 mg/dL — ABNORMAL HIGH (ref 70–99)

## 2013-09-03 MED ORDER — BIOTENE DRY MOUTH MT LIQD
15.0000 mL | Freq: Two times a day (BID) | OROMUCOSAL | Status: DC
  Administered 2013-09-03 – 2013-09-06 (×6): 15 mL via OROMUCOSAL

## 2013-09-03 MED ORDER — ENSURE PUDDING PO PUDG
1.0000 | Freq: Three times a day (TID) | ORAL | Status: DC
  Administered 2013-09-04 (×2): 1 via ORAL

## 2013-09-03 MED ORDER — METHYLPREDNISOLONE SODIUM SUCC 40 MG IJ SOLR
40.0000 mg | Freq: Three times a day (TID) | INTRAMUSCULAR | Status: DC
  Administered 2013-09-03 – 2013-09-05 (×6): 40 mg via INTRAVENOUS
  Filled 2013-09-03 (×6): qty 1

## 2013-09-03 MED ORDER — IPRATROPIUM-ALBUTEROL 0.5-2.5 (3) MG/3ML IN SOLN
3.0000 mL | RESPIRATORY_TRACT | Status: DC
  Administered 2013-09-03 – 2013-09-06 (×18): 3 mL via RESPIRATORY_TRACT
  Filled 2013-09-03 (×20): qty 3

## 2013-09-03 NOTE — Progress Notes (Signed)
NUTRITION FOLLOW UP  Intervention:   Ensure Pudding PO TID, each supplement provides 170 kcal and 4 grams of protein  Nutrition Dx:   Inadequate oral intake related to poor appetite as evidenced by minimal intake of meals, ongoing.  Goal:   Intake to meet >90% of estimated nutrition needs. Unmet.  Monitor:   Respiratory status, PO intake, labs, weight trend.  Assessment:   73 year old male with DM, HTN and COPD. Present[facility] to [facility] 6/27 with acute hypoxemic respiratory failure. Being managed in ICU for septic shock, ARDS.  Patient was extubated on 7/8. TF off since extubation. RN reports that patient with decreased alertness and poor PO intake. Diet has been advanced to dysphagia 3 with nectar thick liquids. Patient is requiring BiPAP on and off.   Height: Ht Readings from Last 1 Encounters:  08/17/13 5\' 10"  (1.778 m)    Weight Status:   Wt Readings from Last 1 Encounters:  08/29/13 141 lb 12.1 oz (64.3 kg)  08/22/13  165 lb 5.5 oz (75 kg)  08/19/13  172 lb 9.9 oz (78.3 kg)   Re-estimated needs:  Kcal: 1800-2000 Protein: 85-100 gm  Fluid: 2 L  Skin: 2 stage 2 pressure ulcers to back  Diet Order: Dysphagia 3 with nectar thick liquids   Intake/Output Summary (Last 24 hours) at 09/03/13 1329 Last data filed at 09/03/13 1300  Gross per 24 hour  Intake   1335 ml  Output    884 ml  Net    451 ml    Last BM: 7/10   Labs:   Recent Labs Lab 08/28/13 0430 08/29/13 0411 08/31/13 0234  NA 143 140 143  K 4.3 4.5 4.7  CL 99 98 102  CO2 36* 32 32  BUN 33* 17 13  CREATININE 0.68 0.53 0.57  CALCIUM 8.5 8.9 8.9  GLUCOSE 192* 245* 146*    CBG (last 3)   Recent Labs  09/03/13 0300 09/03/13 0723 09/03/13 1212  GLUCAP 168* 190* 253*    Scheduled Meds: . antiseptic oral rinse  15 mL Mouth Rinse q12n4p  . chlorhexidine  15 mL Mouth Rinse BID  . enoxaparin (LOVENOX) injection  40 mg Subcutaneous Q24H  . folic acid  1 mg Oral Daily  .  insulin aspart  0-15 Units Subcutaneous 6 times per day  . ipratropium-albuterol  3 mL Nebulization Q4H  . methylPREDNISolone (SOLU-MEDROL) injection  40 mg Intravenous 3 times per day    Continuous Infusions: . sodium chloride 10 mL/hr at 08/27/13 0010    Joaquin CourtsKimberly Harris, RD, LDN, CNSC Pager [phone] After Hours Pager [phone]

## 2013-09-03 NOTE — Progress Notes (Signed)
PULMONARY / CRITICAL CARE MEDICINE   Name: [name] [name] MRN: [id number] DOB: 08-Dec-1940    ADMISSION DATE:  08/17/2013  PRIMARY SERVICE: PCCM  CHIEF COMPLAINT:  Respiratory distress.   BRIEF PATIENT DESCRIPTION:  52 yobm with DM, HTN and COPD. Presented from [facility] 6/27 with acute hypoxemic respiratory failure, secondary to ARDS in setting of septic shock.  SIGNIFICANT EVENTS / STUDIES:  6/27 - Admit w resp fx, cxr w bilateral patchy infiltrates.  Intubated & tx to [facility] 6/28 - ECHO (LVEF 60-65%, Grade 1 Diastolic dysfunction) 6/29 - paralyzed, peep 12, pressors required 6/30 - FOB: suctioned complete obstruction  7/01 - CXR unchanged diffuse b/l edema / infiltrate 7/02 - neg 4.6 liters 7/03 - hypoglycemia event (cbg 34), alert, on fentanyl only 7/04 - weaned most of day, marginal mental status 7/05 - weaning PSV (low volumes, mild tachypnea) 7/06 - continue wean with poor progress, CXR significantly improved aeration, b/l lower lobe atelectasis 7/07 - continue wean, improved progress, anticipate trial extubation on 7/8 7/08 -  extubation, CXR mostly stable with inc LLL opacity 7/09 - transfer to SDU, s/p extubation 7/8, CXR stable, eval for CIR placement 7/10 ST >dys 3 (mechanical soft) diet with nectar thick liquids. 7/11 resp distress/ placed back on bipap, NCB status   LINES / TUBES: 6/27 ETT >> 7/08 6/28 R rad a line>>>7/02  6/27 L [place] CVC >> 7/09   CULTURES: Bood cultures 6/27 >>> Negative Tracheal aspirate 6/27 >>> negative Urine culture 6/27 >>> Negative Resp virus PCR 6/28 >>> Negative Urine leg 6/28 >>> Negative Urine strep 6/28 >>> Negative Bronch 6/30 >>> oropharyngeal flora, HSV neg C.Diff 7/6 >> Negative  ANTIBIOTICS: - Completed levaquin for COPD exacerbation PTA.  Zosyn 6/27>>>6/29 Imipenem 6/29>>>7/1 Vancomycin 6/27>>> 6/30 Rocephin 7/1 >>> 7/8  SUBJECTIVE:  Pt improved. Coughing up mucus   VITAL SIGNS: Temp:  [97.4 F (36.3  C)-99.1 F (37.3 C)] 99.1 F (37.3 C) (07/14 0301) Pulse Rate:  [85-115] 92 (07/14 0309) Resp:  [23-39] 31 (07/14 0309) BP: (118-132)/(50-80) 132/67 mmHg (07/14 0309) SpO2:  [93 %-100 %] 96 % (07/14 0630) FiO2 (%):  [40 %-50 %] 40 % (07/14 0309)    VENTILATOR SETTINGS: Vent Mode:  [-]  FiO2 (%):  [40 %-50 %] 40 % Off  bipapc sats ok on 2L INTAKE / OUTPUT: Intake/Output     07/13 0701 - 07/14 0700 07/14 0701 - 07/15 0700   P.O. 60    I.V. (mL/kg) 1350 (21)    Total Intake(mL/kg) 1410 (21.9)    Urine (mL/kg/hr) 684 (0.4)    Total Output 684     Net +726            Intake/Output Summary (Last 24 hours) at 09/03/13 0809 Last data filed at 09/03/13 4098  Gross per 24 hour  Intake   1410 ml  Output    684 ml  Net    726 ml    PHYSICAL EXAMINATION:  Gen: chronically ill more alert HEENT: OP clear, NCAT PULM: Diminished bases, few crackles CV: RRR, no mgr AB: BS+, soft, nontender Ext: warm no edema Neuro: awake and alert, maew  LABS:  Recent Labs Lab 08/28/13 0430 08/29/13 0411 08/31/13 0234  NA 143 140 143  K 4.3 4.5 4.7  CL 99 98 102  CO2 36* 32 32  BUN 33* 17 13  CREATININE 0.68 0.53 0.57  GLUCOSE 192* 245* 146*    Recent Labs Lab 08/28/13 0430 08/29/13 0411  HGB 9.1* 10.2*  HCT 29.2* 32.9*  WBC 10.5 7.9  PLT 339 397      CXR: no film 7/13   ASSESSMENT / PLAN:  PULMONARY A: Acute hypoxemic and hypercarbic respiratory failure due to ARDS from HCAP >7/10 tachypneic > suspect due to resolving ALI, does not appear worse than las few days COPD > not in exacerbation Dysphagia P:    - maintain in SDU  - dysphagia diet - Cont supplemental O2 - Cont duoneb scheduled q4h -cont empiric IV medrol -flutter Prn bipap daytime and qhs on schedule   CARDIOVASCULAR A: Septic Shock / Hypotension, resolved Diastolic CHF compensated Hypertension, controlled P:  - Cont amlodipine - Monitor fluid status Note has diuresed 8L and looks dry    RENAL A:   Hypernatremia, resolved Metabolic Alkalosis, resolved Hypokalemia, resolved Hyponatremia, resolved P:   - Monitor BMET intermittently - Monitor I/Os - Correct electrolytes as indicated   GASTROINTESTINAL A:   N/V, resolved Diarrhea, C diff neg Post extubation dysphagia  P:  - Dysphagia diet per ST (see 7/10 eval)  HEMATOLOGIC A:   Anemia, macrocytic P:  - DVT px: LMWH - Monitor CBC intermittently - Cont empiric folate  INFECTIOUS A:   HCAP, treated P:   Off all abx  ENDOCRINE A:   DM 2 Episodic hypoglycemia P:   - Cont CBGs/SSI  NEUROLOGIC A:   Acute Encephalopathy,resolved but recurs with elevated pCO2 Mild pain from lying in bed Profound deconditioning Hx of Dementia P:   - Fentanyl PRN  Family updated at bedside, they confirm no further intubations or vent/icu. Will cont aggressive medical management short of icu/vent/reintub/trach etc.  Caryl Bisatrick WrightMD Beeper  [phone]  Cell  [phone]  If no response or cell goes to voicemail, call beeper [phone]  8:09 AM 09/03/2013

## 2013-09-03 NOTE — Progress Notes (Signed)
SLP Cancellation Note  Patient Details Name: [name] MRN: [id number] DOB: 12-04-1940   Cancelled treatment:       Reason Eval/Treat Not Completed: Other (comment) (pt on bipap due to his complaint of respiratory issues, RN reports pt with good tolerance of intake today but poor appetite )   Mills KollerKimball, Arhum Peeples Ann Evelia Waskey, MS [facility] SLP [phone]

## 2013-09-03 NOTE — Progress Notes (Signed)
Pt. States he wears CPAP at home but doesn't wish to wear BIPAP tonight. Pt. Stated that he wants to "leave it off" BIPAP is in pt.'s room on standby if needed.

## 2013-09-03 NOTE — Progress Notes (Signed)
Physical Therapy Treatment Patient Details Name: [name] [name] MRN: [id number] DOB: April 30, 1940 Today's Date: 09/03/2013    History of Present Illness Pt admit with VDRF with COPD exacerbation.  REcent admit for COPD exacerbation. Pt with return to Bipap 7/11 and PRN    PT Comments    Pt on arrival in chair with sats 97% on 2L , HR 101 but stating he feels SOB and requesting return to bed and Bipap. Family present and confirm pt in chair 3 hrs off bipap and then began to state he felt bad and wanted to pray. RR 20-40 throughout session without maintained tachypnea. Pt denied attempting ambulation or bil LE HEP. Family educated for bil LE HEP and potential changes necessary for discharge plan pending medical course and participation. Will continue to follow. RN aware of all above and RT contacted.   Follow Up Recommendations  CIR;Supervision/Assistance - 24 hour (if pt able to return to increases participation but should fatigue and respiratory difficulty persist may need to consider SNF)     Equipment Recommendations       Recommendations for Other Services       Precautions / Restrictions Precautions Precautions: Fall Restrictions Weight Bearing Restrictions: No    Mobility  Bed Mobility   Bed Mobility: Sit to Supine       Sit to supine: Min assist   General bed mobility comments: cues for sequence with assist to bring legs onto bed   Transfers Overall transfer level: Needs assistance   Transfers: Sit to/from Stand;Stand Pivot Transfers Sit to Stand: Min assist Stand pivot transfers: Min assist       General transfer comment: cues for hand placement, safety and sequence with hand held assist for pivot. Pt required mod assist for reciprocal scooting with pad to edge of chair prior to being able to stand  Ambulation/Gait Ambulation/Gait assistance:  (pt denied attempting)               Stairs            Wheelchair Mobility    Modified Rankin  (Stroke Patients Only)       Balance                                    Cognition Arousal/Alertness: Awake/alert Behavior During Therapy: Flat affect Overall Cognitive Status: Impaired/Different from baseline Area of Impairment: Orientation Orientation Level: Time;Place Current Attention Level: Focused   Following Commands: Follows one step commands with increased time Safety/Judgement: Decreased awareness of deficits;Decreased awareness of safety   Problem Solving: Difficulty sequencing;Requires verbal cues;Requires tactile cues      Exercises      General Comments        Pertinent Vitals/Pain No pain    Home Living                      Prior Function            PT Goals (current goals can now be found in the care plan section) Progress towards PT goals: Not progressing toward goals - comment (limited by fatigue)    Frequency       PT Plan Discharge plan needs to be updated    Co-evaluation             End of Session   Activity Tolerance: Patient limited by fatigue Patient left: in bed;with call bell/phone within reach;with family/visitor present;with  nursing/sitter in room     Time: [phone] PT Time Calculation (min): 18 min  Charges:  $Therapeutic Activity: 8-22 mins                    G Codes:      Delorse Lekabor, Clell Trahan Beth 09/03/2013, 10:08 AM Delaney MeigsMaija Tabor Rogerick Baldwin, PT [phone]

## 2013-09-03 NOTE — Progress Notes (Signed)
Called to pt's room by RN, pt placed back on Bipap per pt request, pt resting comfortably in bed, no distress noted, RT will continue to monitor

## 2013-09-03 NOTE — Progress Notes (Signed)
Patient c/o of SOB and midsternal chest pressure/pain. O2 sats 95% on Bipap. No increased work of breathing. EKG performed. Dr. Darrick Pennaeterding notified. Family at bedside. Patient refused pain meds. Repositioned in bed. Encouraged to perform relaxation techniques. Family supportive and attempting to decrease patient's anxiety. Will continue to monitor.

## 2013-09-04 DIAGNOSIS — E119 Type 2 diabetes mellitus without complications: Secondary | ICD-10-CM

## 2013-09-04 DIAGNOSIS — I10 Essential (primary) hypertension: Secondary | ICD-10-CM

## 2013-09-04 LAB — GLUCOSE, CAPILLARY
GLUCOSE-CAPILLARY: 148 mg/dL — AB (ref 70–99)
GLUCOSE-CAPILLARY: 168 mg/dL — AB (ref 70–99)
GLUCOSE-CAPILLARY: 210 mg/dL — AB (ref 70–99)
GLUCOSE-CAPILLARY: 223 mg/dL — AB (ref 70–99)
Glucose-Capillary: 193 mg/dL — ABNORMAL HIGH (ref 70–99)
Glucose-Capillary: 218 mg/dL — ABNORMAL HIGH (ref 70–99)

## 2013-09-04 NOTE — Progress Notes (Signed)
I have spoken with [name] [name] at bedside and she tells me she would like to meet tomorrow to discuss goals of care and options and that her family will not be able to meet until tomorrow afternoon due to work. We will plan to meet tomorrow 7/16 1400 pm. Thank you for this consult.  Yong ChannelAlicia Isabellah Sobocinski, NP Palliative Medicine Team Pager # [phone] (M-F 8a-5p) Team Phone # [phone] (Nights/Weekends)

## 2013-09-04 NOTE — Progress Notes (Signed)
SLP Cancellation Note  Patient Details Name: [name] [name] MRN: [id number] DOB: 05/06/1940   Cancelled treatment:       Reason Eval/Treat Not Completed: Medical issues which prohibited therapy. Pt on BiPAP currently but RN reports he has been on and off of it. Note palliative care consult; ST will continue to follow to determine safe/ most comfortable diet.   [place]ro Kungleksiak, Amy K, MA, CCC-SLP 09/04/2013, 9:53 AM

## 2013-09-04 NOTE — Progress Notes (Signed)
Inpatient Diabetes Program Recommendations  AACE/ADA: New Consensus Statement on Inpatient Glycemic Control (2013)  Target Ranges:  Prepandial:   less than 140 mg/dL      Peak postprandial:   less than 180 mg/dL (1-2 hours)      Critically ill patients:  140 - 180 mg/dL  Results for [name] [name], [name] [name] (MRN [id number]) as of 09/04/2013 14:19  Ref. Range 09/03/2013 20:28 09/03/2013 23:42 09/04/2013 03:53 09/04/2013 07:16 09/04/2013 11:44  Glucose-Capillary Latest Range: 70-99 mg/dL 621251 (H) 308110 (H) 657223 (H) 168 (H) 193 (H)   Inpatient Diabetes Program Recommendations Insulin - Basal: consider Levemir 5 units  Correction (SSI): change to TID  Thank you  Piedad ClimesGina Jahniyah Revere BSN, RN,CDE Inpatient Diabetes Coordinator [phone] (team pager)

## 2013-09-04 NOTE — Progress Notes (Signed)
PULMONARY / CRITICAL CARE MEDICINE   Name: [name] [name] MRN: [id number] DOB: 15-Mar-1940    ADMISSION DATE:  08/17/2013  PRIMARY SERVICE: PCCM  CHIEF COMPLAINT:  Respiratory distress.   BRIEF PATIENT DESCRIPTION:  57 yobm with DM, HTN and COPD. Presented from [facility] 6/27 with acute hypoxemic respiratory failure, secondary to ARDS in setting of septic shock.  SIGNIFICANT EVENTS / STUDIES:  6/27 - Admit w resp fx, cxr w bilateral patchy infiltrates.  Intubated & tx to [facility] 6/28 - ECHO (LVEF 60-65%, Grade 1 Diastolic dysfunction) 6/29 - paralyzed, peep 12, pressors required 6/30 - FOB: suctioned complete obstruction  7/01 - CXR unchanged diffuse b/l edema / infiltrate 7/02 - neg 4.6 liters 7/03 - hypoglycemia event (cbg 34), alert, on fentanyl only 7/04 - weaned most of day, marginal mental status 7/05 - weaning PSV (low volumes, mild tachypnea) 7/06 - continue wean with poor progress, CXR significantly improved aeration, b/l lower lobe atelectasis 7/07 - continue wean, improved progress, anticipate trial extubation on 7/8 7/08 -  extubation, CXR mostly stable with inc LLL opacity 7/09 - transfer to SDU, s/p extubation 7/8, CXR stable, eval for CIR placement 7/10 ST >dys 3 (mechanical soft) diet with nectar thick liquids. 7/11 resp distress/ placed back on bipap, NCB status   LINES / TUBES: 6/27 ETT >> 7/08 6/28 R rad a line>>>7/02  6/27 L [place] CVC >> 7/09   CULTURES: Bood cultures 6/27 >>> Negative Tracheal aspirate 6/27 >>> negative Urine culture 6/27 >>> Negative Resp virus PCR 6/28 >>> Negative Urine leg 6/28 >>> Negative Urine strep 6/28 >>> Negative Bronch 6/30 >>> oropharyngeal flora, HSV neg C.Diff 7/6 >> Negative  ANTIBIOTICS: - Completed levaquin for COPD exacerbation PTA.  Zosyn 6/27>>>6/29 Imipenem 6/29>>>7/1 Vancomycin 6/27>>> 6/30 Rocephin 7/1 >>> 7/8  SUBJECTIVE:  Pt back slid. Not eating. Less responsive off bipap. Is bipap  dependent  VITAL SIGNS: Temp:  [96.5 F (35.8 C)-98.5 F (36.9 C)] 97.6 F (36.4 C) (07/15 0700) Pulse Rate:  [86-101] 93 (07/15 0353) Resp:  [23-43] 33 (07/15 0353) BP: (112-144)/(56-68) 144/68 mmHg (07/15 0353) SpO2:  [96 %-100 %] 100 % (07/15 0353) FiO2 (%):  [40 %] 40 % (07/14 1201)    VENTILATOR SETTINGS: Vent Mode:  [-]  FiO2 (%):  [40 %] 40 % Off  bipapc sats ok on 2L INTAKE / OUTPUT: Intake/Output     07/14 0701 - 07/15 0700 07/15 0701 - 07/16 0700   P.O. 300    I.V. (mL/kg)     Total Intake(mL/kg) 300 (4.7)    Urine (mL/kg/hr) 875 (0.6) 125 (1.5)   Total Output 875 125   Net -575 -125          Intake/Output Summary (Last 24 hours) at 09/04/13 0818 Last data filed at 09/04/13 0720  Gross per 24 hour  Intake    300 ml  Output   1000 ml  Net   -700 ml    PHYSICAL EXAMINATION:  Gen: chronically ill less alert off bipap HEENT: OP clear, NCAT PULM: Diminished bases, few crackles CV: RRR, no mgr AB: BS+, soft, nontender Ext: warm no edema Neuro: awake and alert, maew  LABS:  Recent Labs Lab 08/29/13 0411 08/31/13 0234  NA 140 143  K 4.5 4.7  CL 98 102  CO2 32 32  BUN 17 13  CREATININE 0.53 0.57  GLUCOSE 245* 146*    Recent Labs Lab 08/29/13 0411  HGB 10.2*  HCT 32.9*  WBC 7.9  PLT 397      CXR: no film 7/13   ASSESSMENT / PLAN:  PULMONARY A: Acute hypoxemic and hypercarbic respiratory failure due to ARDS from HCAP COPD >  Exacerbation bipap dependent Dysphagia P:    - maintain in SDU  - dysphagia diet - Cont supplemental O2 - Cont duoneb scheduled q4h -cont empiric IV medrol -flutter Prn bipap daytime and qhs on schedule Plan pall care consult.  ??[facility]hospice Not able to get off bipap    CARDIOVASCULAR A: Septic Shock / Hypotension, resolved Diastolic CHF compensated Hypertension, controlled P:  - Cont amlodipine - Monitor fluid status  RENAL A:   Hypernatremia, resolved Metabolic Alkalosis,  resolved Hypokalemia, resolved Hyponatremia, resolved P:   - Monitor BMET intermittently - Monitor I/Os - Correct electrolytes as indicated   GASTROINTESTINAL A:   N/V, resolved Diarrhea, C diff neg Post extubation dysphagia  P:  - Dysphagia diet per ST (see 7/10 eval)  HEMATOLOGIC A:   Anemia, macrocytic P:  - DVT px: LMWH - Monitor CBC intermittently - Cont empiric folate  INFECTIOUS A:   HCAP, treated P:   Off all abx  ENDOCRINE A:   DM 2 Episodic hypoglycemia P:   - Cont CBGs/SSI  NEUROLOGIC A:   Acute Encephalopathy,resolved but recurs with elevated pCO2 Mild pain from lying in bed Profound deconditioning Hx of Dementia P:   - Fentanyl PRN  Family updated at bedside, they confirm no further intubations or vent/icu. Get pall care involved   Dorcas Carrowatrick WrightMD Beeper  [phone]  Cell  [phone]  If no response or cell goes to voicemail, call beeper [phone]  8:18 AM 09/04/2013

## 2013-09-04 NOTE — Progress Notes (Signed)
CARE MANAGEMENT NOTE 09/04/2013  Patient:  [name] [name],[name] [name]   Account Number:  [id number]  Date Initiated:  08/19/2013  Documentation initiated by:  [facility]  Subjective/Objective Assessment:   Admitted with resp failure - intubated.     Action/Plan:   Anticipated DC Date:  09/04/2013   Anticipated DC Plan:  IP REHAB FACILITY      DC Planning Services  CM consult      Choice offered to / List presented to:             Status of service:  In process, will continue to follow Medicare Important Message given?  YES (If response is "NO", the following Medicare IM given date fields will be blank) Date Medicare IM given:  08/28/2013 Medicare IM given by:  [facility] Date Additional Medicare IM given:   Additional Medicare IM given by:    Discharge Disposition:    Per UR Regulation:  Reviewed for med. necessity/level of care/duration of stay  If discussed at Long Length of Stay Meetings, dates discussed:   08/22/2013  09/03/2013  09/05/2013    Comments:  Contact:  Rosa,Dorothy Spouse [phone]                 Drabik,Garyn Son [phone]                  Britt BoozerRoberson,Gerald Son [phone]   09-04-13 [address] Graves-Bigelow, RN,BSN [phone] Pt continues on IV soulmedrol and duonebs. Bipap prn day and qhs schedule. Palliative Care to f/u 09-05-13 @ 2pm family meeting. CM will continue to monitor for disposition needs.  09/02/13- 1125- Donn PieriniKristi Webster RN, BSN [phone] Pt placed back on BIPAP over weekend for resp. distress- pt remains on bipap this AM- family considering moving to comfort care only- pt already a DNR- CIR following, CSW also following for potential placement needs- NCM to follow for potential needs.  08/29/13- 1500- Donn PieriniKristi Webster RN, BSN [phone] Transferred to SDU today- UR completed- CIR following for possible admission  08-28-13 2:20pm Avie ArenasSarah Brown, RNBSN - [phone] Extubated this am - dc'd IV antibiotics.  PT assisted with getting out of bed to  chair and recommending CIR.  CIR consult placed.  08-26-13 2:25pm Avie ArenasSarah Brown, RNBSN [phone] Remains on vent and fentanyl.   Hope to extubate soon - May need ST trach - would be good Ltach candidate.  08-21-13 6:40am Johny ShearsSarah Brown,RNBSN - [phone] Remains on vent

## 2013-09-05 DIAGNOSIS — R29818 Other symptoms and signs involving the nervous system: Secondary | ICD-10-CM

## 2013-09-05 DIAGNOSIS — I10 Essential (primary) hypertension: Secondary | ICD-10-CM

## 2013-09-05 DIAGNOSIS — F411 Generalized anxiety disorder: Secondary | ICD-10-CM

## 2013-09-05 DIAGNOSIS — J9600 Acute respiratory failure, unspecified whether with hypoxia or hypercapnia: Secondary | ICD-10-CM

## 2013-09-05 DIAGNOSIS — J189 Pneumonia, unspecified organism: Secondary | ICD-10-CM

## 2013-09-05 DIAGNOSIS — R0609 Other forms of dyspnea: Secondary | ICD-10-CM

## 2013-09-05 DIAGNOSIS — E119 Type 2 diabetes mellitus without complications: Secondary | ICD-10-CM

## 2013-09-05 DIAGNOSIS — R0989 Other specified symptoms and signs involving the circulatory and respiratory systems: Secondary | ICD-10-CM

## 2013-09-05 DIAGNOSIS — R0600 Dyspnea, unspecified: Secondary | ICD-10-CM

## 2013-09-05 DIAGNOSIS — Z515 Encounter for palliative care: Secondary | ICD-10-CM

## 2013-09-05 DIAGNOSIS — F0390 Unspecified dementia without behavioral disturbance: Secondary | ICD-10-CM

## 2013-09-05 LAB — GLUCOSE, CAPILLARY
GLUCOSE-CAPILLARY: 81 mg/dL (ref 70–99)
GLUCOSE-CAPILLARY: 132 mg/dL — AB (ref 70–99)
GLUCOSE-CAPILLARY: 69 mg/dL — AB (ref 70–99)
Glucose-Capillary: 157 mg/dL — ABNORMAL HIGH (ref 70–99)
Glucose-Capillary: 212 mg/dL — ABNORMAL HIGH (ref 70–99)

## 2013-09-05 MED ORDER — MORPHINE SULFATE 2 MG/ML IJ SOLN
1.0000 mg | INTRAMUSCULAR | Status: DC | PRN
  Administered 2013-09-05 – 2013-09-06 (×4): 1 mg via INTRAVENOUS
  Filled 2013-09-05 (×4): qty 1

## 2013-09-05 MED ORDER — DEXTROSE-NACL 5-0.45 % IV SOLN
INTRAVENOUS | Status: DC
  Administered 2013-09-05: 13:00:00 via INTRAVENOUS

## 2013-09-05 MED ORDER — METHYLPREDNISOLONE SODIUM SUCC 40 MG IJ SOLR
40.0000 mg | Freq: Every day | INTRAMUSCULAR | Status: DC
  Administered 2013-09-06: 40 mg via INTRAVENOUS
  Filled 2013-09-05: qty 1

## 2013-09-05 MED ORDER — DEXTROSE 50 % IV SOLN
INTRAVENOUS | Status: AC
  Filled 2013-09-05: qty 50

## 2013-09-05 MED ORDER — LORAZEPAM 2 MG/ML IJ SOLN: 1.0000 mg | INTRAMUSCULAR | Status: DC | PRN

## 2013-09-05 MED ORDER — DEXTROSE 50 % IV SOLN
25.0000 mL | Freq: Once | INTRAVENOUS | Status: AC
  Administered 2013-09-05: 25 mL via INTRAVENOUS

## 2013-09-05 NOTE — Progress Notes (Signed)
Placed pt on BiPAP at 2146 and am now taking pt off due to him trying to pull of the mask.

## 2013-09-05 NOTE — Progress Notes (Signed)
We will sign off at this time. Noted palliative plans. Please call me with any questions. [phone]

## 2013-09-05 NOTE — Clinical Social Work Note (Signed)
Patient's family has chosen full comfort care for patient. [facility] are [facility]. CSW spoke with facility's liaison who will call the room to set up a time to meet with the family in the morning. Facility is able to do Bi-Pap as long as there is "no backup rate". CSW updated PMT. CSW will regroup with family and Lafonda MossesDiana in AM.   Roddie McBryant Elliett Guarisco MSW, Dun[place]vyLCSWA, [place], [phone]

## 2013-09-05 NOTE — Consult Note (Signed)
Patient ZO:XWRUEA [name]      DOB: 1940-10-09      VWU:[id number]     Consult Note from the Palliative Medicine Team at [facility]    Consult Requested by: Dr [name] [name]    PCP: No PCP Per Patient Reason for Consultation:  Clarification of GOC and options     Phone Number:None  Assessment of patients Current state:  Continued physical, functional and cognitive decline secondary to chronic hypoxemic respiratory failure.  Poor po intake, only sips and bites, (most recent albumin 1.6) continued lethargy with minimal interaction. Family faced with advanced directive decisions and anticipatory care needs.    Consult is for review of medical treatment options, clarification of goals of care and end of life issues, disposition and options, and symptom recommendation.  This NP [name] [name] reviewed medical records, received report from team, assessed the patient and then meet at the patient's bedside along with his large family to include his wife, two sons, daughter and several grandchildren, bothers and nephews  to discuss diagnosis prognosis, GOC, EOL wishes disposition and options.  A detailed discussion was had today regarding advanced directives.  Concepts specific to code status, artifical feeding and hydration, continued IV antibiotics and rehospitalization was had.  The difference between a aggressive medical intervention path  and a palliative comfort care path for this patient at this time was had.  Values and goals of care important to patient and family were attempted to be elicited.  Concept of Hospice and Palliative Care were discussed.  MOST form was detailed  Natural trajectory and expectations at EOL were discussed.  Questions and concerns addressed.  Hard Choices booklet left for review. Family encouraged to call with questions or concerns.  PMT will continue to support holistically.    Goals of Care: 1.  Code Status:    DNR/DNI-comfort is main focus of care  2. Scope of  Treatment: 1. Vital Signs:daily  2. Respiratory/Oxygen:  Family is hopeful to continue the BiPAP as a comfort measure.  Presently the patient finds comfort in intermittent use. NO escalation of settings,  utilize prn medications to treat symptoms   3. Nutritional Support/Tube Feeds: no artifical feeding now or in the future 4. Antibiotics:none 5. Blood Products:none 6. GNF:AOZH 7. Review of Medications to be discontinued:minimize for comfort 8. Labs:none 9. Telemetry:none  3. Disposition: Hopeful for in-patent residential hospice in [facility]. (family lives in [place]).  Discussed with [name] [name].  Placed call to [facility] to inquire about BiPap, await call back.  Will write for choice. Prognosis is likely less that two weeks  4. Symptom Management:   1. Anxiety/Agitation:  Ativan 1 mg every 4 hrs IV prn 2. Pain/Dyspnea: Morphine 1 mg every 2 hrs IV prn                                   Bipap for comfort only, no escalation of settings, utilize prn medications.  (discussed with Cary RT)  5. Psychosocial:  Family verbalized misunderstanding of treatment plan over the last several days.  They tell me they did not understand that comfort had been totally decided at this point, questioned details of plan of care. Concerned with miss communication.    Education and information offered.  Through a detailed goals of care meeting all family members were able to verbalize an understanding of limited prognosis, treatment plan to enhance comfort and hope for in-paient hospice facility.  6. Spiritual:  Chaplain involved  Patient Documents Completed or Given: Document Given Completed  Advanced Directives Pkt    MOST yes   DNR    Gone from My Sight    Hard Choices yes     Brief HPI:  [name] [name] is a 73 y.o. male with history of HTN, DM type 2, progressive dementia past few months, COPD with recent exacerbation ICU stay at [facility] who was discharged to home three weeks ago but continued to have SOB  with progressive weakness. He was admitted on 08/17/13 with hypoxemic respiratory failure with decreased LOC requiring intubation. Patient with ARDS due to septic shock and treated with IV antibiotics for pneumonia as well as pressors due to hypotension. CT head without acute abnormality. Patient with delirium with severe agitation. Started on vent wean with improvement in mental status and extubated to oxygen per [place] on 08/28/13    Now DNR/DNI and focus is comfort as discussed with family today (09-05-13) with this NP   ROS: unable to illicit due to altered mental status   PMH:  Past Medical History  Diagnosis Date  . Hypertension   . Diabetes mellitus without complication   . COPD (chronic obstructive pulmonary disease)   . Dementia     per family reports mild forgetfulness     ZOX:WRUEAVW reviewed. No pertinent past surgical history. I have reviewed the FH and SH and  If appropriate update it with new information. Allergies  Allergen Reactions  . Lisinopril Other (See Comments)    Unknown reaction   Scheduled Meds: . antiseptic oral rinse  15 mL Mouth Rinse q12n4p  . chlorhexidine  15 mL Mouth Rinse BID  . enoxaparin (LOVENOX) injection  40 mg Subcutaneous Q24H  . feeding supplement (ENSURE)  1 Container Oral TID BM  . folic acid  1 mg Oral Daily  . insulin aspart  0-15 Units Subcutaneous 6 times per day  . ipratropium-albuterol  3 mL Nebulization Q4H  . [START ON 09/06/2013] methylPREDNISolone (SOLU-MEDROL) injection  40 mg Intravenous Daily   Continuous Infusions: . sodium chloride 10 mL/hr at 08/27/13 0010   PRN Meds:.sodium chloride, albuterol, fentaNYL, ondansetron (ZOFRAN) IV, RESOURCE THICKENUP CLEAR    BP 123/89  Pulse 96  Temp(Src) 98.1 F (36.7 C) (Oral)  Resp 22  Ht 5\' 10"  (1.778 m)  Wt 64.3 kg (141 lb 12.1 oz)  BMI 20.34 kg/m2  SpO2 97%   PPS:30 % at best, tolerating only sips and bites   Intake/Output Summary (Last 24 hours) at 09/05/13 1104 Last  data filed at 09/05/13 0718  Gross per 24 hour  Intake      0 ml  Output    550 ml  Net   -550 ml     Physical Exam:  General:  Ill appearing,  Dyspneic on minimal exertion HEENT:  Moist buccal membranes Chest:  Diminished throughout CVS: tachycardic, rate 110 Abdomen: soft NT +BS Ext: without edema Neuro: lethargic, follows simple commands  Labs: CBC    Component Value Date/Time   WBC 7.9 08/29/2013 0411   RBC 3.19* 08/29/2013 0411   HGB 10.2* 08/29/2013 0411   HCT 32.9* 08/29/2013 0411   PLT 397 08/29/2013 0411   MCV 103.1* 08/29/2013 0411   MCH 32.0 08/29/2013 0411   MCHC 31.0 08/29/2013 0411   RDW 15.6* 08/29/2013 0411   LYMPHSABS 0.8 08/21/2013 0530   MONOABS 0.3 08/21/2013 0530   EOSABS 0.1 08/21/2013 0530   BASOSABS 0.0 08/21/2013 0530  BMET    Component Value Date/Time   NA 143 08/31/2013 0234   K 4.7 08/31/2013 0234   CL 102 08/31/2013 0234   CO2 32 08/31/2013 0234   GLUCOSE 146* 08/31/2013 0234   BUN 13 08/31/2013 0234   CREATININE 0.57 08/31/2013 0234   CALCIUM 8.9 08/31/2013 0234   GFRNONAA >90 08/31/2013 0234   GFRAA >90 08/31/2013 0234    CMP     Component Value Date/Time   NA 143 08/31/2013 0234   K 4.7 08/31/2013 0234   CL 102 08/31/2013 0234   CO2 32 08/31/2013 0234   GLUCOSE 146* 08/31/2013 0234   BUN 13 08/31/2013 0234   CREATININE 0.57 08/31/2013 0234   CALCIUM 8.9 08/31/2013 0234   PROT 4.8* 08/20/2013 0407   ALBUMIN 1.6* 08/20/2013 0407   AST 22 08/20/2013 0407   ALT 16 08/20/2013 0407   ALKPHOS 46 08/20/2013 0407   BILITOT <0.2* 08/20/2013 0407   GFRNONAA >90 08/31/2013 0234   GFRAA >90 08/31/2013 0234    Time In Time Out Total Time Spent with Patient Total Overall Time  1400 1700 120 min 180 min    Greater than 50%  of this time was spent counseling and coordinating care related to the above assessment and plan.   [name] CreedMary Kaeleb Emond NP  Palliative Medicine Team Team Phone # [phone] Pager [phone]  Discussed with Dr Lendon ColonelZubelevitsiy

## 2013-09-05 NOTE — Progress Notes (Signed)
PULMONARY / CRITICAL CARE MEDICINE   Name: [name] [name] MRN: [id number] DOB: 05/17/1940    ADMISSION DATE:  08/17/2013  PRIMARY SERVICE: PCCM  CHIEF COMPLAINT:  Respiratory distress.   BRIEF PATIENT DESCRIPTION:  73 yo with COPD. Presented from [facility] 6/27 with acute hypoxemic respiratory failure, secondary to ARDS in setting of septic shock.  SIGNIFICANT EVENTS / STUDIES:  6/27    Admitted with bilateral airspace disease, intubated 6/28    ECHO >>> LVEF 60-65%, Grade 1 DD 6/29    Paralyzed 6/30    Therapeutic FOB 7/08    Extubation 7/09    Transfered to SDU, eval for CIR placement 7/11    Resp distress / back on bipap, NCB status  LINES / TUBES: 6/27 ETT >>> 7/08 6/28 R rad a line >>> 7/02  6/27 L [place] CVC >>> 7/09  CULTURES: 6/27   Bood >>> neg 6/27   Resp >>> neg 6/27   Urine >>> neg 6/28   RVP >>> neg 6/30   BAL >>> neg 7/6     Diff 7/6 >>> neg  ANTIBIOTICS: Zosyn 6/27 >>> 6/29 Imipenem 6/29 >>> 7/1 Vancomycin 6/27 >>> 6/30 Rocephin 7/1 >>> 7/8  INTERVAL HISTORY: No issues overnight.  Breathing improved  VITAL SIGNS: Temp:  [97.9 F (36.6 C)-98.6 F (37 C)] 98.1 F (36.7 C) (07/16 0700) Pulse Rate:  [76-103] 96 (07/16 0920) Resp:  [22-47] 22 (07/16 0920) BP: (116-136)/(64-89) 123/89 mmHg (07/16 0700) SpO2:  [96 %-100 %] 97 % (07/16 0920) FiO2 (%):  [40 %] 40 % (07/16 0007)    VENTILATOR SETTINGS: Vent Mode:  [-]  FiO2 (%):  [40 %] 40 %  INTAKE / OUTPUT: Intake/Output     07/15 0701 - 07/16 0700 07/16 0701 - 07/17 0700   P.O. 120    Total Intake(mL/kg) 120 (1.9)    Urine (mL/kg/hr) 750 (0.5) 50 (0.2)   Total Output 750 50   Net -630 -50        Urine Occurrence 3 x       PHYSICAL EXAMINATION:  Gen:  No distress, resting comfortable HEENT: PERRL PULM:   Bilateral diminished air entry, few rales CV:  Regular, no murmurs AB: Soft, bowel sounds present Ext: No edema Neuro: Awake, alert  LABS: CBC No results found for  this basename: WBC, HGB, HCT, PLT,  in the last 168 hours  Coag's No results found for this basename: APTT, INR,  in the last 168 hours  BMET  Recent Labs Lab 08/31/13 0234  NA 143  K 4.7  CL 102  CO2 32  BUN 13  CREATININE 0.57  GLUCOSE 146*   Electrolytes  Recent Labs Lab 08/31/13 0234  CALCIUM 8.9   Sepsis Markers No results found for this basename: LATICACIDVEN, PROCALCITON, O2SATVEN,  in the last 168 hours  ABG  Recent Labs Lab 08/31/13 1220  PHART 7.247*  PCO2ART 87.9*  PO2ART 159.0*   Liver Enzymes No results found for this basename: AST, ALT, ALKPHOS, BILITOT, ALBUMIN,  in the last 168 hours  Cardiac Enzymes No results found for this basename: TROPONINI, PROBNP,  in the last 168 hours  Glucose  Recent Labs Lab 09/04/13 1144 09/04/13 1634 09/04/13 1932 09/04/13 2354 09/05/13 0404 09/05/13 0720  GLUCAP 193* 218* 210* 148* 81 157*   IMAGING: No results found.  ASSESSMENT / PLAN:  PULMONARY A: Acute hypoxemic and hypercarbic respiratory failure due to ARDS from HCAP COPD exacerbation P:   Goal SpO2>92  Supplemental oxygen BiPAP PRN Albuterol / Atrovent Decrease Solu-Medrol to daily Flutter valve DNI Palliative Care consulted  CARDIOVASCULAR A: Chronic diastolic congestive heart failure HTN P:  DNR  RENAL A:   No active issues P:   Trend BMP   GASTROINTESTINAL A:   Dysphagia GI Px is not required P:  Dysphagia diet  HEMATOLOGIC A:   Anemia VTE Px P:  Lovenox Folate  INFECTIOUS A:   HCAP COPD exacerbation P:   Completed abx  ENDOCRINE A:   DM II Episodic hypoglycemia P:   SSI D51/2NS@75   NEUROLOGIC A:   Dementia Profound deconditioning Pain P:   Fentanyl PRN  I have personally obtained history, examined patient, evaluated and interpreted laboratory and imaging results, reviewed medical records, formulated assessment / plan and placed orders.  Lonia FarberZUBELEVITSKIY, Anaily Ashbaugh, MD Pulmonary and  Critical Care Medicine [facility] Pager: [phone]  09/05/2013, 10:45 AM

## 2013-09-05 NOTE — Progress Notes (Signed)
Speech Language Pathology Treatment: Dysphagia  Patient Details Name: [name] [name] MRN: [id number] DOB: [birth year] Today's Date: 09/05/2013 Time: [id number] SLP Time Calculation (min): 27 min  Assessment / Plan / Recommendation Clinical Impression  SLP provided Total A for oral care via suctioning to clean oral cavity prior to skilled observation of advanced trials of ice chips. Pt politely declined all PO offered including Dys 3/Dys 1 textures and nectar thick liquids, only accepting ice chips and water. Pt consumed 6 ice chips with intermittent wet vocal quality noted, requiring Max cues to return to baseline with throat clearing. SLP provided education to patient and wife regarding the risk of aspiration at this time, and encouraged them to continue with current diet textures. Per chart review, note Palliative Care meeting schedule for this afternoon; will continue to follow pending GOC decisions.   HPI HPI: 73 y/o with DM, HTN and COPD. [facility] with acute hypoxemic respiratory failure, secondary to ARDS in setting of septic shock. Intubated from 6/27 to 7/8. Son reports coughing with liquids and expectoration of mucous at baseline.   Pertinent Vitals n/a  SLP Plan  Continue with current plan of care    Recommendations Diet recommendations: Dysphagia 3 (mechanical soft);Nectar-thick liquid Liquids provided via: Cup;Straw Medication Administration: Whole meds with puree Supervision: Staff to assist with self feeding;Full supervision/cueing for compensatory strategies Compensations: Slow rate;Small sips/bites Postural Changes and/or Swallow Maneuvers: Out of bed for meals;Seated upright 90 degrees              Oral Care Recommendations: Oral care BID Follow up Recommendations: Other (comment) (TBD pending GOC meeting this afternoon) Plan: Continue with current plan of care    GO      [name] [name], M.A.  CCC-SLP [phone]  [name] [name], [name] [name] 09/05/2013, 9:36 AM

## 2013-09-05 NOTE — Progress Notes (Signed)
Physical Therapy Treatment and Discharge Patient Details Name: [name] [name] MRN: [id number] DOB: 1940/03/19 Today's Date: 09/05/2013    History of Present Illness Pt admit with VDRF with COPD exacerbation.  REcent admit for COPD exacerbation. Pt with return to Bipap 7/11 and PRN    PT Comments    Pt willing to transfer to recliner during session today. Since PT session occurred, noted that pt and family had Palliative Care meeting and full comfort care has been chosen. Pt is hoping for transfer to [facility] at d/c. PT will sign off at this time. If needs change, please reconsult.    Follow Up Recommendations  CIR;Supervision/Assistance - 24 hour     Equipment Recommendations  None recommended by PT    Recommendations for Other Services Rehab consult     Precautions / Restrictions Precautions Precautions: Fall Restrictions Weight Bearing Restrictions: No    Mobility  Bed Mobility Overal bed mobility: Needs Assistance Bed Mobility: Supine to Sit     Supine to sit: Min guard     General bed mobility comments: Pt able to transition to EOB with VC's for initiation of movement, as well as for guarding for safety during trunk elevation.   Transfers Overall transfer level: Needs assistance Equipment used: None Transfers: Sit to/from [employer] Sit to Stand: Min guard Stand pivot transfers: Min guard       General transfer comment: Pt demonstrated proper hand placement on seated surface for safety. Somewhat unsteady when stepping around to the chair however no physical assist was required and pt was overall safe in transfer.   Ambulation/Gait                 Stairs            Wheelchair Mobility    Modified Rankin (Stroke Patients Only)       Balance Overall balance assessment: History of Falls;Needs assistance Sitting-balance support: Feet supported;Bilateral upper extremity supported Sitting balance-Leahy  Scale: Fair     Standing balance support: Bilateral upper extremity supported;During functional activity Standing balance-Leahy Scale: Poor Standing balance comment: Although no physical assist was required, still had hands on guarding for pt during standing balance and transfer to chair. Would not be safe with supervision only.                     Cognition Arousal/Alertness: Awake/alert Behavior During Therapy: Flat affect Overall Cognitive Status: Impaired/Different from baseline                      Exercises General Exercises - Lower Extremity Ankle Circles/Pumps: 10 reps Quad Sets: 5 reps Heel Slides: 10 reps Straight Leg Raises: 10 reps    General Comments        Pertinent Vitals/Pain Vitals stable throughout session.     Home Living                      Prior Function            PT Goals (current goals can now be found in the care plan section) Acute Rehab PT Goals Patient Stated Goal: to go home PT Goal Formulation: With patient Time For Goal Achievement: 09/11/13 Potential to Achieve Goals: Good Progress towards PT goals: Progressing toward goals    Frequency  Min 3X/week    PT Plan Discharge plan needs to be updated    Co-evaluation  End of Session Equipment Utilized During Treatment: Gait belt;Oxygen Activity Tolerance: Patient limited by fatigue Patient left: in chair;with chair alarm set;with call bell/phone within reach     Time: 1550-1618 PT Time Calculation (min): 28 min  Charges:  $Therapeutic Exercise: 8-22 mins $Therapeutic Activity: 8-22 mins                    G Codes:      Ruthann CancerHamilton, Breslyn Abdo 09/05/2013, 5:11 PM  Ruthann CancerLaura Hamilton, PT, DPT Acute Rehabilitation Services Pager: [phone]

## 2013-09-06 LAB — GLUCOSE, CAPILLARY
GLUCOSE-CAPILLARY: 138 mg/dL — AB (ref 70–99)
GLUCOSE-CAPILLARY: 185 mg/dL — AB (ref 70–99)
Glucose-Capillary: 181 mg/dL — ABNORMAL HIGH (ref 70–99)
Glucose-Capillary: 213 mg/dL — ABNORMAL HIGH (ref 70–99)

## 2013-09-06 MED ORDER — CHLORHEXIDINE GLUCONATE 0.12 % MT SOLN: 15.0000 mL | Freq: Two times a day (BID) | OROMUCOSAL | Status: AC

## 2013-09-06 MED ORDER — MORPHINE SULFATE (CONCENTRATE) 10 MG /0.5 ML PO SOLN: 5.0000 mg | ORAL | Status: AC | PRN

## 2013-09-06 MED ORDER — LORAZEPAM 1 MG PO TABS: 1.0000 mg | ORAL_TABLET | ORAL | Status: AC | PRN

## 2013-09-06 MED ORDER — ENSURE PUDDING PO PUDG: 1.0000 | Freq: Three times a day (TID) | ORAL | Status: AC

## 2013-09-06 MED ORDER — IPRATROPIUM-ALBUTEROL 0.5-2.5 (3) MG/3ML IN SOLN: 3.0000 mL | RESPIRATORY_TRACT | Status: DC

## 2013-09-06 MED ORDER — ALBUTEROL SULFATE (2.5 MG/3ML) 0.083% IN NEBU: 2.5000 mg | INHALATION_SOLUTION | RESPIRATORY_TRACT | Status: DC | PRN

## 2013-09-06 NOTE — Discharge Summary (Signed)
Patrina Andreas, MD Pulmonary and Critical Care Medicine [facility] Pager: [phone]  

## 2013-09-06 NOTE — Progress Notes (Signed)
Patient being discharged to [facility]. Gave 1mg  iv morphine prior to DC. [facility] here to transfer patient. VSS. Patient placed on 2 L o2. Pt complaining of IV so removed PIV. Condom catheter in place, CDI. Family at bedside with belongings. Tylene Fantasiaalled Sue at hospice and gave report and they are aware of patient arrival.   [facility] Jazlen Ogarro,RN

## 2013-09-06 NOTE — Discharge Summary (Signed)
Physician Discharge Summary  Patient ID: [name] [name] MRN: [id number] DOB/AGE: 08/31/1940 73 y.o.  Admit date: 08/17/2013 Discharge date: 09/06/2013    Discharge Diagnoses:  Acute Hypoxemic & Hypercarbic Respiratory Failure secondary to HCAP with ARDS Acute Exacerbation of COPD Chronic Diastolic CHF HTN Dysphagia Anemia  Diabetes Mellitus Episodic Hypoglycemia Dementia Deconditioning Pain                                                                        DISCHARGE PLAN BY DIAGNOSIS     Acute Hypoxemic & Hypercarbic Respiratory Failure secondary to HCAP with ARDS Acute Exacerbation of COPD Chronic Diastolic CHF HTN Dysphagia Anemia  Diabetes Mellitus Episodic Hypoglycemia Dementia Deconditioning Pain   Discharge Plan: -Discharge to [facility]  -Morphine PRN for SOB, Pain -Ativan PRN for anxiety -DNR/DNI -Oxygen PRN for comfort -Albuterol + Atrovent Q4 with Q3 PRN Albuterol                  DISCHARGE SUMMARY   [name] [name] is a 73 y.o.  y/o male with a PMH of COPD. Presented from [facility] 6/27 with acute hypoxemic respiratory failure, secondary to HCAP with ARDS in setting of septic shock.  His initial evaluation in the ER noted oxygen saturations in the 20's requiring intubation.  CXR demonstrated diffuse bilateral airspace disease.  ECHO on 6/28 revealed LVEF of 60-65%, Grade 1 diastolic dysfunction.  Early ICU course was complicated by ventilator desynchrony requiring IV paralytics and heavy sedation.  He underwent therapeutic FOB on 6/30 with negative cultures.  He was ultimately extubated on 7/8 and transferred to SDU.  Patient had ongoing delirium while on SDU.  Unfortunately, he suffered another episode of respiratory distress on 7/11 and was placed back on BiPAP.  Given prolonged critical illness and significant decline, he was made a DNR.  He continued to have decline / failure to thrive and patient was progressed to full  comfort care.    Currently, he is awake, alert, able to swallow but has not had desire to eat.  He has used bipap intermittently for comfort measure only.  Focus proceeding is comfort measures only.  See above for details.    SIGNIFICANT EVENTS / STUDIES:  6/27 Admitted with bilateral airspace disease, intubated  6/28 ECHO >>> LVEF 60-65%, Grade 1 DD  6/29 Paralyzed  6/30 Therapeutic FOB 7/08 Extubation  7/09 Transfered to SDU, eval for CIR placement  7/11 Resp distress / back on bipap, NCB status   LINES / TUBES:  6/27 ETT >>> 7/08  6/28 R rad a line >>> 7/02  6/27 L [place] CVC >>> 7/09   CULTURES:  6/27 Bood >>> neg  6/27 Resp >>> neg  6/27 Urine >>> neg  6/28 RVP >>> neg  6/30 BAL >>> neg  7/6 Diff 7/6 >>> neg   ANTIBIOTICS:  Zosyn 6/27 >>> 6/29  Imipenem 6/29 >>> 7/1  Vancomycin 6/27 >>> 6/30  Rocephin 7/1 >>> 7/8   Discharge Exam: Gen: No distress, resting comfortable  HEENT: PERRL PULM: Bilateral diminished air entry, few rales  CV: Regular, no murmurs  AB: Soft, bowel sounds present  Ext: No edema  Neuro: Awake, alert   Filed Vitals:   09/06/13 0400 09/06/13   0700 09/06/13 0721 09/06/13 0920  BP: 136/71  136/60   Pulse: 96     Temp: 97.3 F (36.3 C) 98.5 F (36.9 C)    TempSrc: Oral Oral    Resp: 43  38   Height:      Weight:      SpO2: 99%  100% 98%     Discharge Labs  BMET  Recent Labs Lab 08/31/13 0234  NA 143  K 4.7  CL 102  CO2 32  GLUCOSE 146*  BUN 13  CREATININE 0.57  CALCIUM 8.9    Follow-up Information   Follow up with [place].   Contact information:   [address] [place] [address] [phone]         Medication List    STOP taking these medications       albuterol 108 (90 BASE) MCG/ACT inhaler  Commonly known as:  PROVENTIL HFA;VENTOLIN HFA  Replaced by:  albuterol (2.5 MG/3ML) 0.083% nebulizer solution     amLODipine 5 MG tablet  Commonly known as:  NORVASC     aspirin 325 MG  tablet     budesonide-formoterol 160-4.5 MCG/ACT inhaler  Commonly known as:  SYMBICORT     cholecalciferol 1000 UNITS tablet  Commonly known as:  VITAMIN D     docusate sodium 100 MG capsule  Commonly known as:  COLACE     HYDROPHOR EX     insulin detemir 100 UNIT/ML injection  Commonly known as:  LEVEMIR     levofloxacin 500 MG tablet  Commonly known as:  LEVAQUIN     metFORMIN 500 MG tablet  Commonly known as:  GLUCOPHAGE     solifenacin 10 MG tablet  Commonly known as:  VESICARE      TAKE these medications       albuterol (2.5 MG/3ML) 0.083% nebulizer solution  Commonly known as:  PROVENTIL  Take 3 mLs (2.5 mg total) by nebulization every 3 (three) hours as needed for wheezing.     chlorhexidine 0.12 % solution  Commonly known as:  PERIDEX  15 mLs by Mouth Rinse route 2 (two) times daily.     feeding supplement (ENSURE) Pudg  Take 1 Container by mouth 3 (three) times daily between meals.     ipratropium-albuterol 0.5-2.5 (3) MG/3ML Soln  Commonly known as:  DUONEB  Take 3 mLs by nebulization every 4 (four) hours.     LORazepam 1 MG tablet  Commonly known as:  ATIVAN  Place 1 tablet (1 mg total) under the tongue every 4 (four) hours as needed for anxiety or sleep.     morphine CONCENTRATE 10 mg / 0.5 ml concentrated solution  Take 0.25 mLs (5 mg total) by mouth every 2 (two) hours as needed for severe pain or shortness of breath.        Disposition:  [facility]   Discharged Condition: [name] [name] has met maximum benefit of inpatient care and is medically stable and cleared for discharge.  Patient is pending follow up as above.      Time spent on disposition:  Greater than 35 minutes.   Signed: Noe Gens, NP-C [facility] Pgr: [phone] Office: [phone]

## 2013-09-06 NOTE — Clinical Social Work Note (Signed)
Per MD patient ready for DC to [facility]. CSW has requested transport for patient. CSW has notified family of request for transport. Per Diane the facility will have Bi-Pap for patient when he arrives to facility. DC packet with RN. CSW signing off at this time.   Roddie McBryant Jessamine Barcia MSW, [place], [place], [phone]

## 2013-09-08 NOTE — Consult Note (Signed)
I have reviewed this case with our NP and agree with the Assessment and Plan as stated.  Keath Matera L. Tauno Falotico, MD MBA [facility] Phone: [phone] Pager: [phone]   

## 2013-09-10 ENCOUNTER — Other Ambulatory Visit: Payer: Self-pay | Admitting: *Deleted

## 2013-09-10 MED ORDER — IPRATROPIUM-ALBUTEROL 0.5-2.5 (3) MG/3ML IN SOLN: 3.0000 mL | RESPIRATORY_TRACT | Status: AC

## 2013-09-10 MED ORDER — ALBUTEROL SULFATE (2.5 MG/3ML) 0.083% IN NEBU: 2.5000 mg | INHALATION_SOLUTION | RESPIRATORY_TRACT | Status: AC | PRN

## 2013-09-21 DEATH — deceased

## 2015-04-24 IMAGING — CR DG CHEST 1V PORT
1 series · 1 of 1 positions shown · non-contrast
Comparison: Portable exam 8033 hr compared to 08/21/2013

CLINICAL DATA: Pulmonary edema, history hypertension, diabetes,
COPD

EXAM:
PORTABLE CHEST - 1 VIEW

[AP]
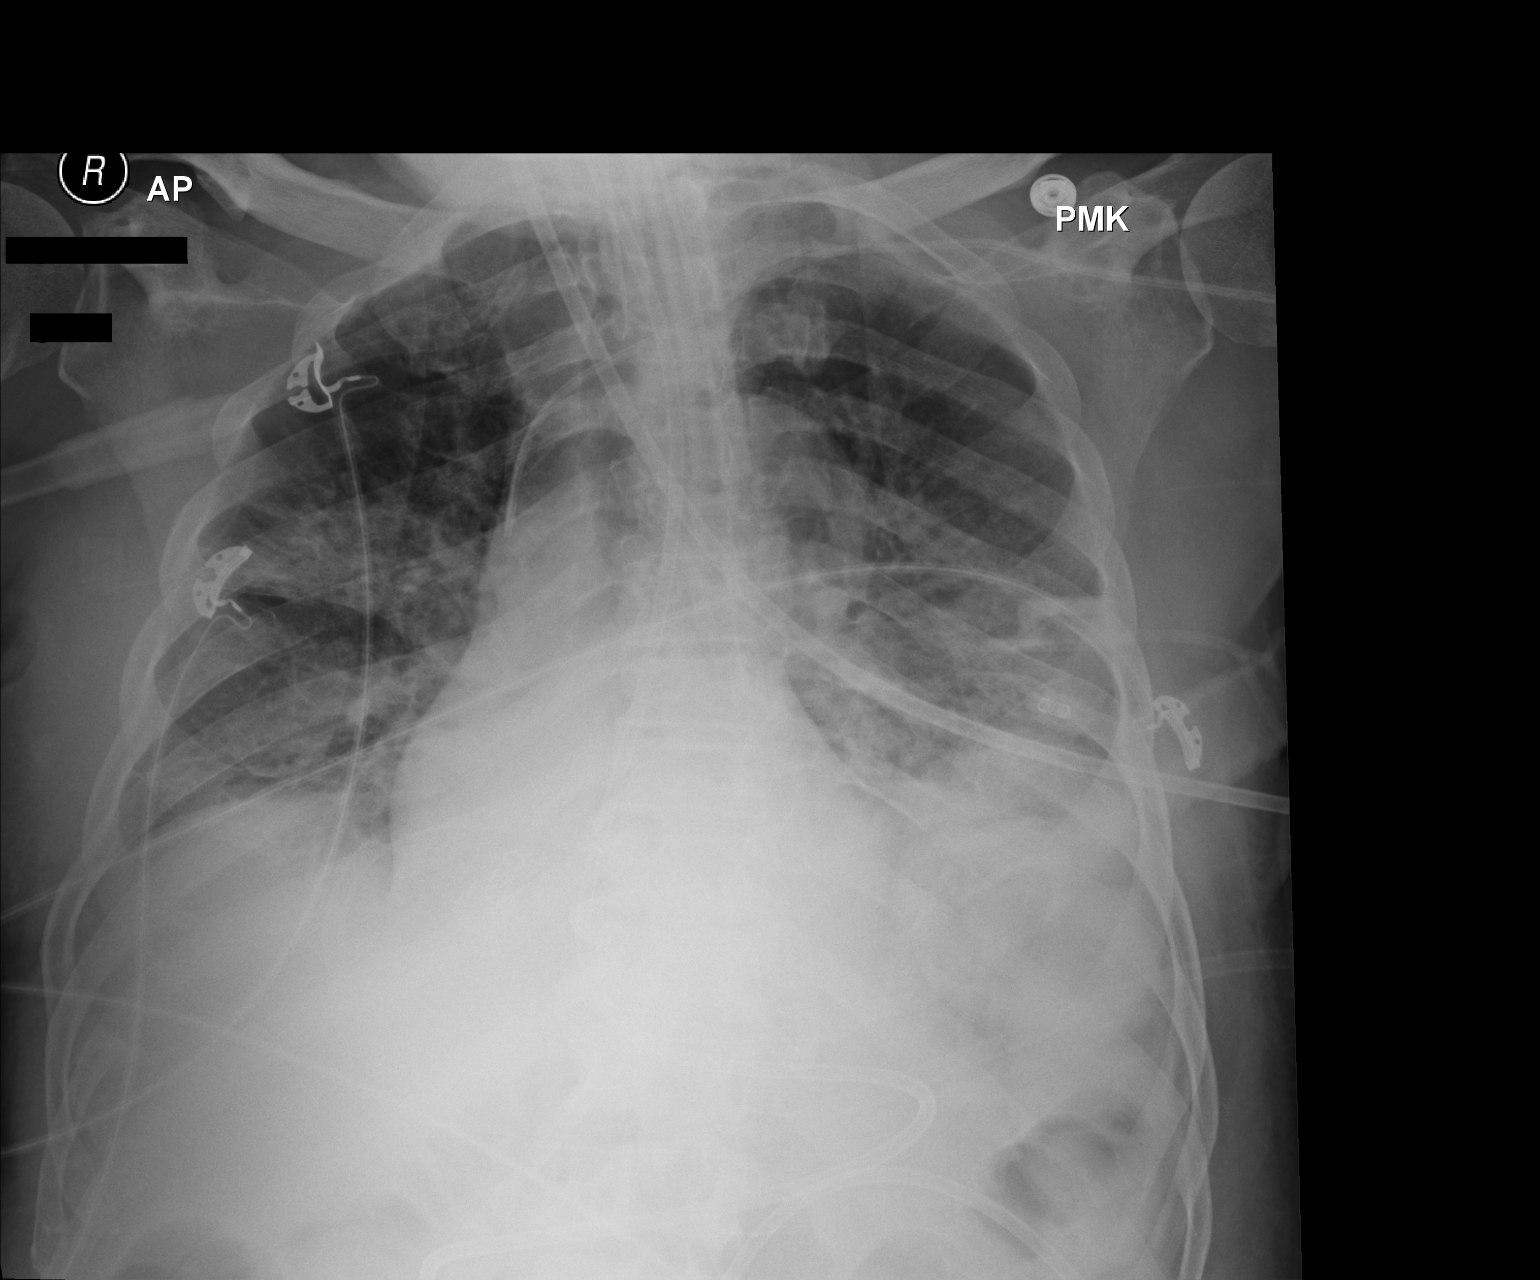

[1 of 1 positions shown; findings below may reference images not displayed]

FINDINGS: Tip of endotracheal tube projects approximately 6.4 cm above carina.

Feeding tube extends into stomach.

LEFT subclavian central venous catheter tip projects over SVC.

Slight rotation to the RIGHT.

Stable heart size.

Scattered BILATERAL pulmonary infiltrates could represent edema
and/or infection.

No gross pleural effusion or pneumothorax.
IMPRESSION: Persistent pulmonary infiltrates.

## 2015-04-26 IMAGING — CR DG CHEST 1V PORT
1 series · 1 of 1 positions shown · non-contrast
Comparison: Yesterday.

CLINICAL DATA: Intubated.

EXAM:
PORTABLE CHEST - 1 VIEW

[AP]
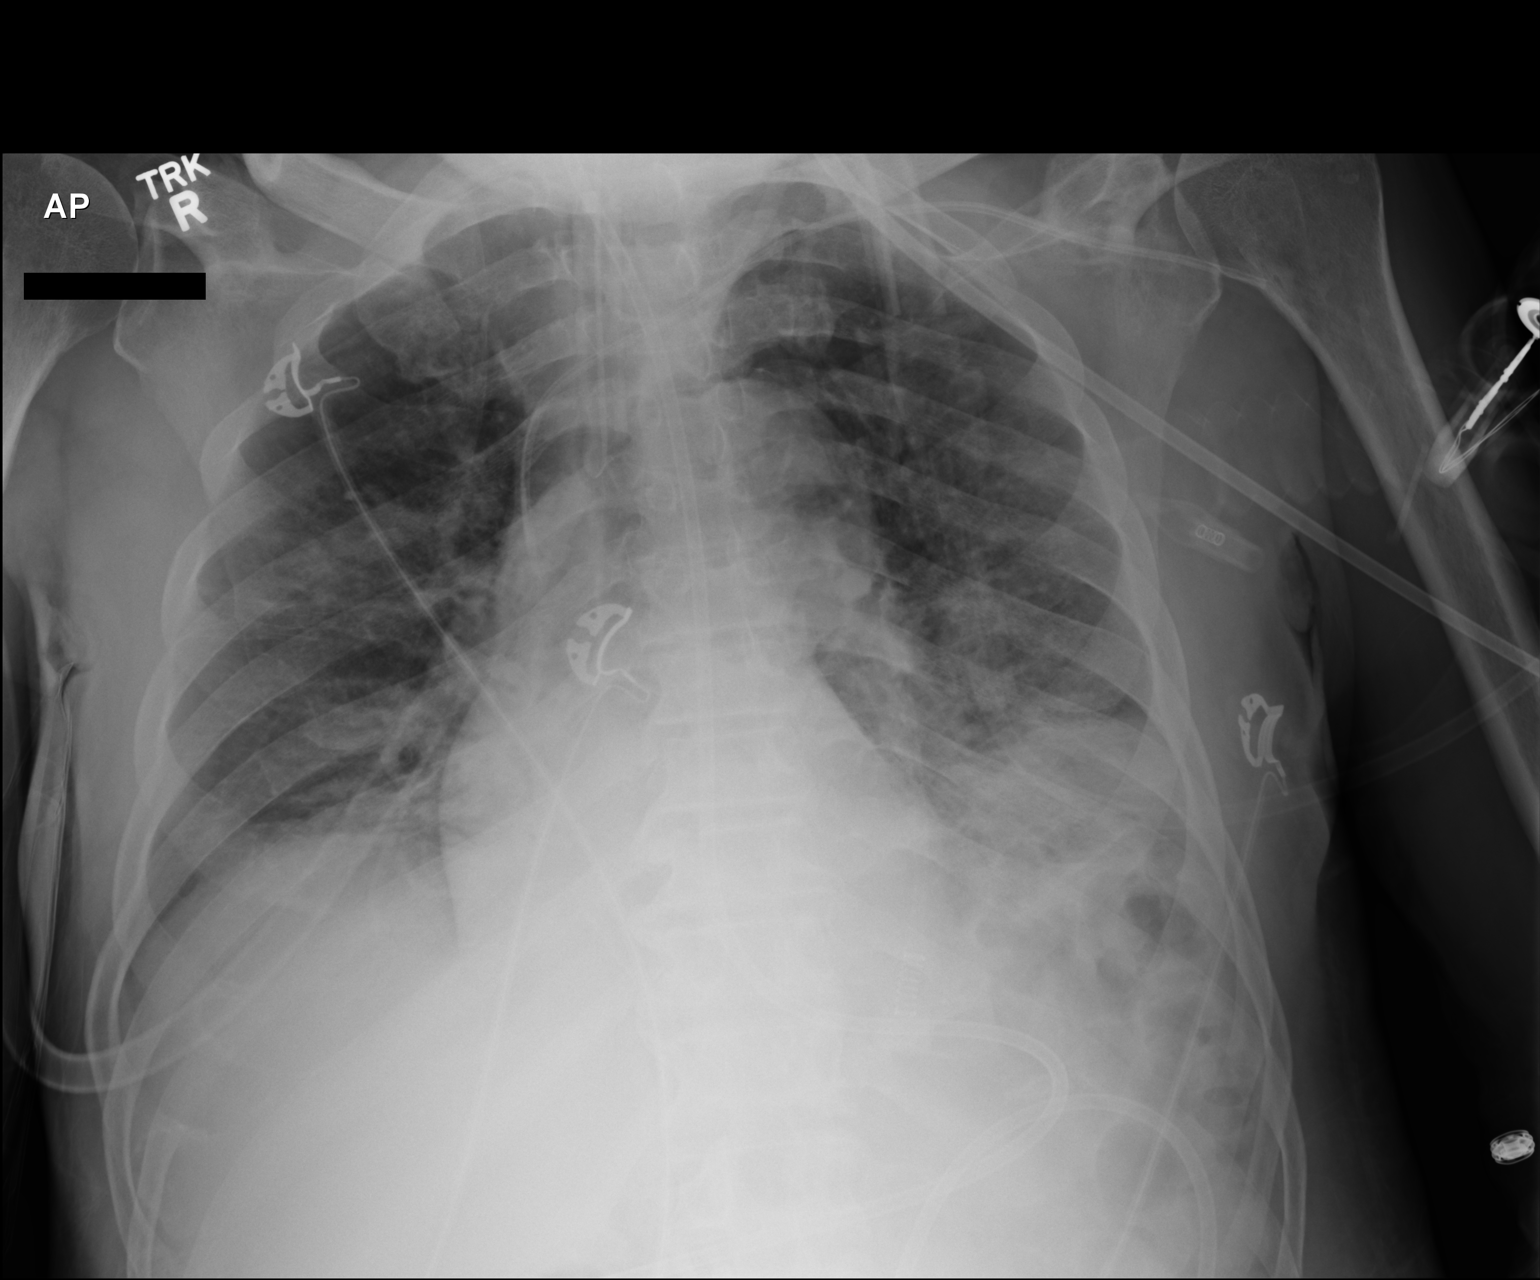

[1 of 1 positions shown; findings below may reference images not displayed]

FINDINGS: Endotracheal tube in satisfactory position. Feeding tube extending
into the stomach. Stable left subclavian catheter. Normal sized
heart. Decreased opacity at both lung bases. Stable prominent
interstitial markings. Thoracic spine degenerative changes.
IMPRESSION: 1. Decreased bibasilar atelectasis.
2. Stable interstitial lung disease.

## 2015-04-27 IMAGING — CR DG CHEST 1V PORT
1 series · 1 of 1 positions shown · non-contrast
Comparison: 08/24/2013

CLINICAL DATA: Check endotracheal tube placement

EXAM:
PORTABLE CHEST - 1 VIEW

[portable]
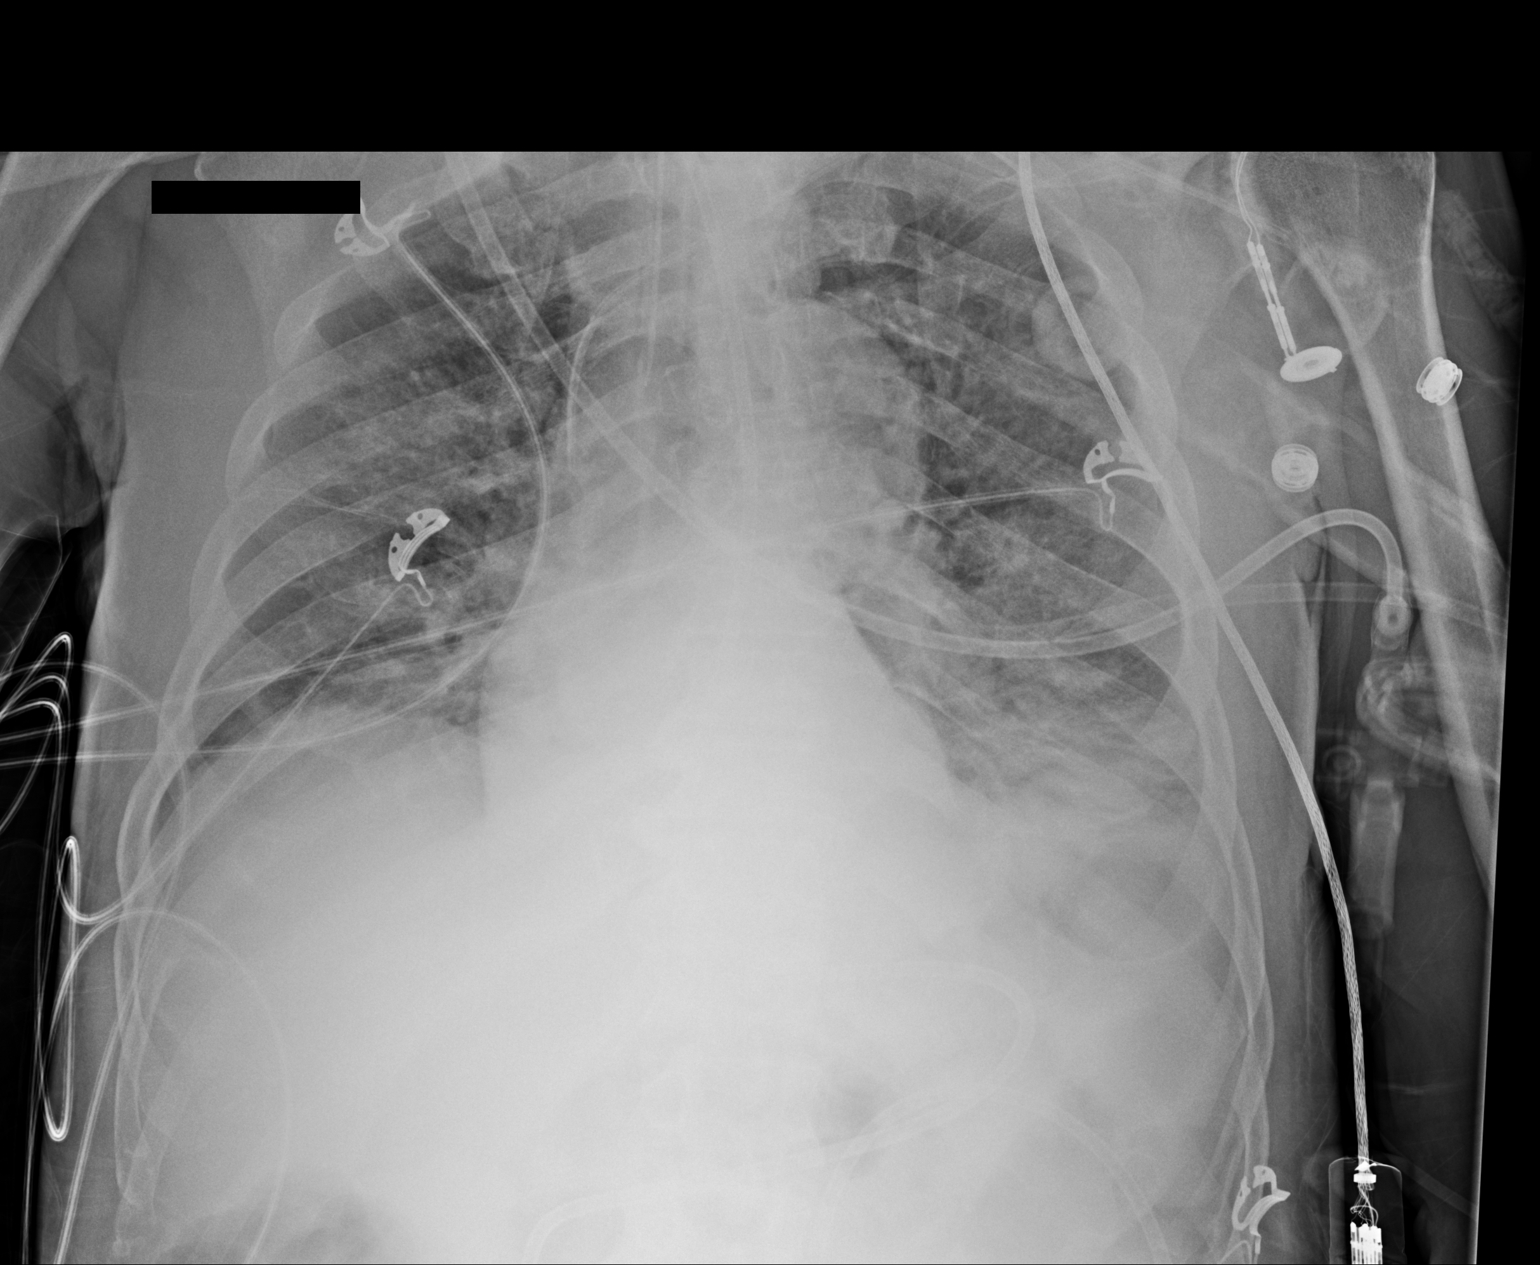

[1 of 1 positions shown; findings below may reference images not displayed]

FINDINGS: Cardiac shadow is stable. A left subclavian central line is again
identified and stable. An endotracheal tube is seen 3.1 cm above the
carina. A feeding catheter is noted within the proximal small bowel.
Bibasilar atelectatic changes are noted worse on the left than the
right. These are stable from the prior study. Mild interstitial
changes are again seen. Patchy atelectatic changes are now noted in
the right upper lobe just above the minor fissure.
IMPRESSION: Tubes and lines as described above.

Stable bibasilar changes.

Increasing right upper lobe atelectatic changes

## 2015-04-28 IMAGING — CR DG CHEST 1V PORT
1 series · 1 of 1 positions shown · non-contrast
Comparison: 08/25/2013

CLINICAL DATA: Respiratory failure.

EXAM:
PORTABLE CHEST - 1 VIEW

[AP]
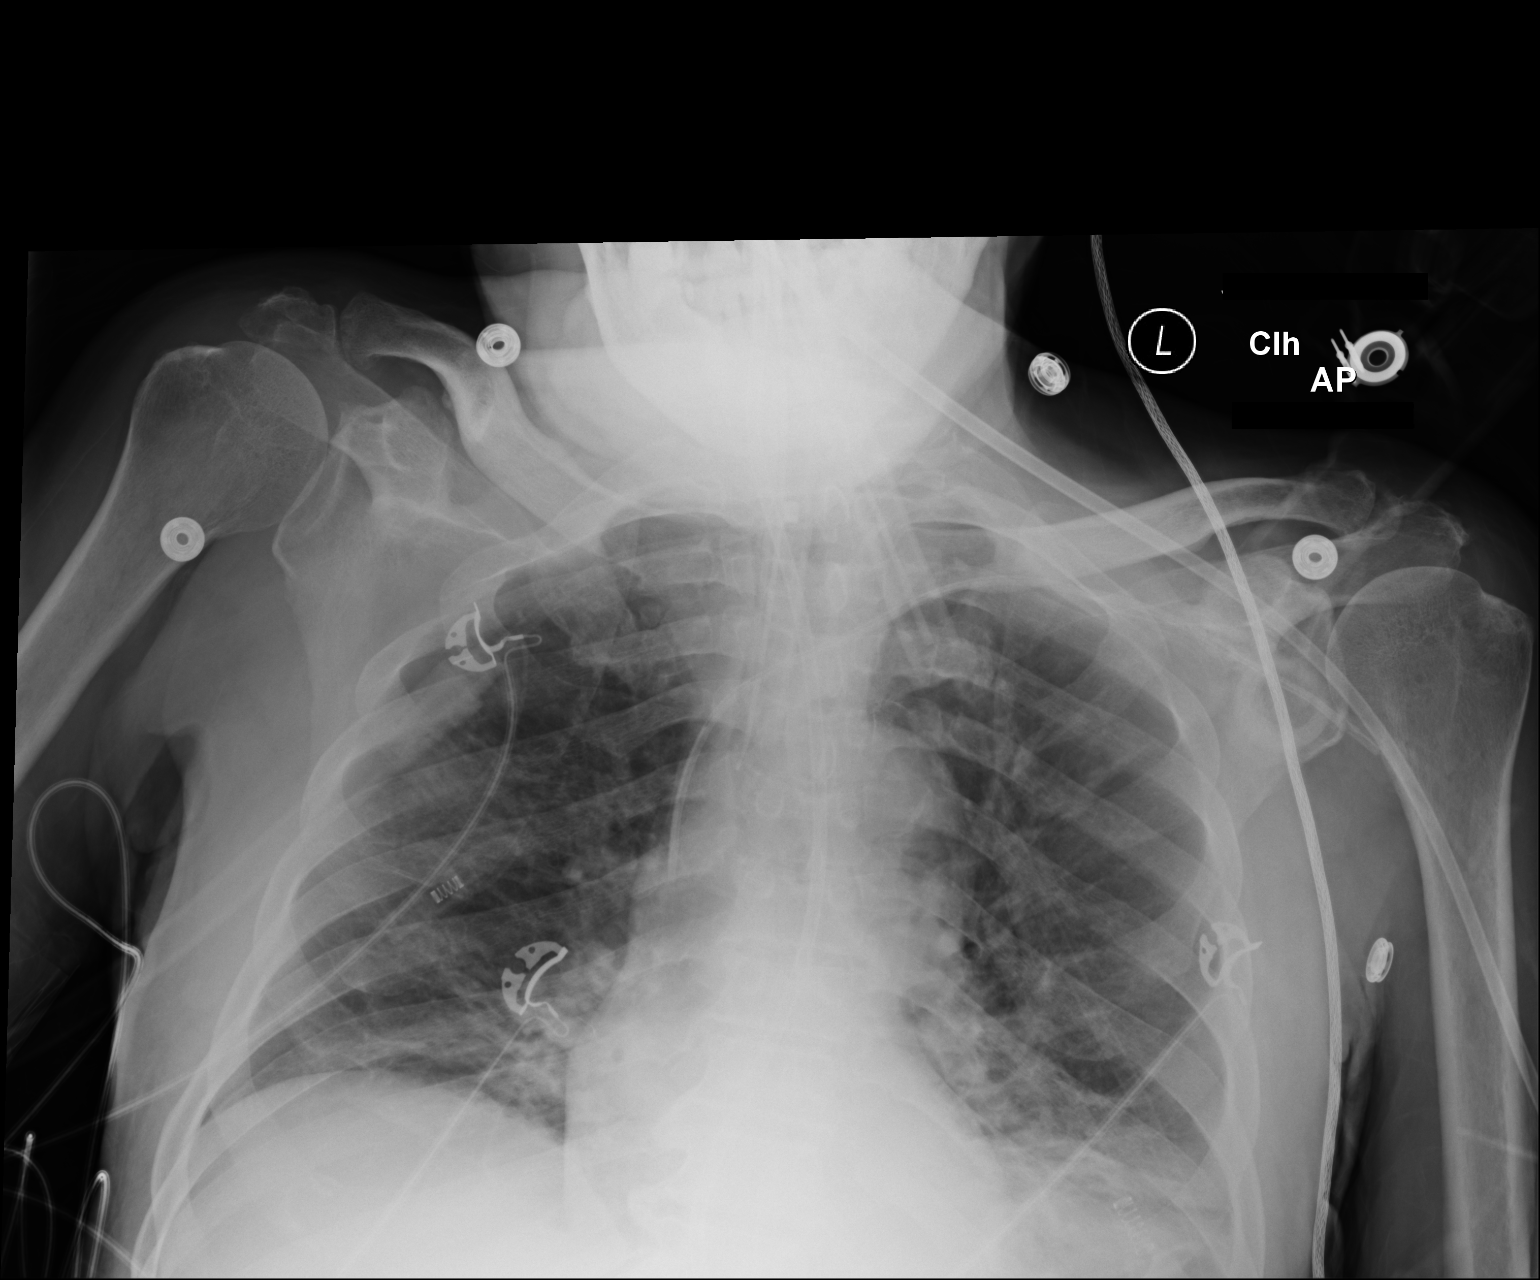

[1 of 1 positions shown; findings below may reference images not displayed]

FINDINGS: The endotracheal tube tip lies approximately 2 cm above the carina.
Central line positioning is stable. Lungs show improved aeration
bilaterally with persistent bibasilar atelectasis/ infiltrates. The
heart size remains normal. No significant pleural effusions are
seen. No pneumothorax.
IMPRESSION: Improved aeration bilaterally with persistent bibasilar atelectasis/
infiltrates.

## 2015-05-01 IMAGING — CR DG CHEST 1V PORT
1 series · 1 of 1 positions shown · non-contrast
Comparison: 08/28/2013

CLINICAL DATA: extubated [DATE], re-eval LLL infiltrate

EXAM:
PORTABLE CHEST - 1 VIEW

[AP]
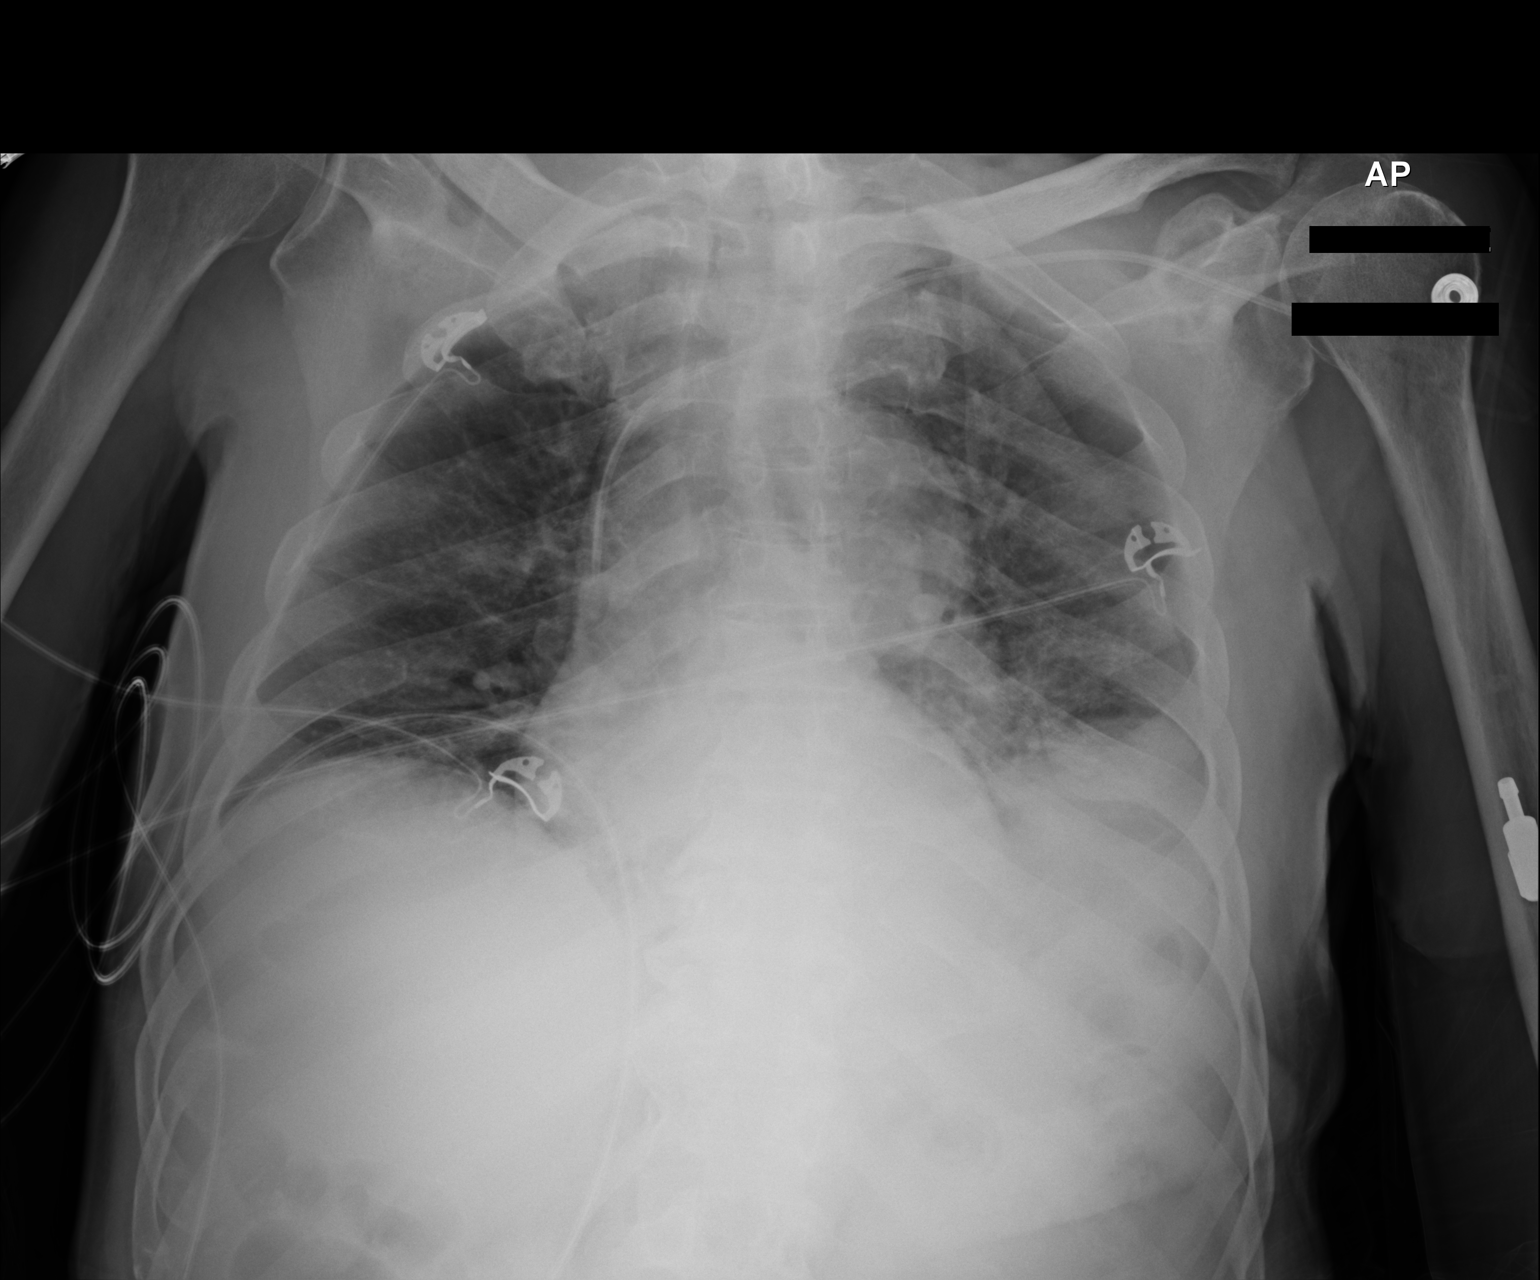

[1 of 1 positions shown; findings below may reference images not displayed]

FINDINGS: Interval extubation. Removal of feeding tube. Left central venous
line remains in place. There is left lower lobe opacity not changed
from prior. Normal cardiac silhouette with ectatic aorta. No
pulmonary edema. Low lung volumes
IMPRESSION: 1. Slight decrease in lung volumes following extubation.
2. No change in left lower lobe atelectasis versus infiltrate.
